# Patient Record
Sex: Male | Born: 1985 | Race: Black or African American | Hispanic: No | Marital: Single | Smoking: Current every day smoker
Health system: Southern US, Community
[De-identification: ages and names within clinical notes are randomized; demographics above are authoritative.]

## PROBLEM LIST (undated history)

## (undated) DIAGNOSIS — F209 Schizophrenia, unspecified: Secondary | ICD-10-CM

## (undated) DIAGNOSIS — F319 Bipolar disorder, unspecified: Secondary | ICD-10-CM

---

## 2004-05-14 ENCOUNTER — Emergency Department (HOSPITAL_COMMUNITY): Admission: EM | Admit: 2004-05-14 | Discharge: 2004-05-14 | Payer: Self-pay | Admitting: Emergency Medicine

## 2005-05-15 ENCOUNTER — Emergency Department (HOSPITAL_COMMUNITY): Admission: EM | Admit: 2005-05-15 | Discharge: 2005-05-16 | Payer: Self-pay | Admitting: *Deleted

## 2006-01-03 ENCOUNTER — Emergency Department (HOSPITAL_COMMUNITY): Admission: EM | Admit: 2006-01-03 | Discharge: 2006-01-03 | Payer: Self-pay | Admitting: Emergency Medicine

## 2006-01-08 ENCOUNTER — Emergency Department (HOSPITAL_COMMUNITY): Admission: EM | Admit: 2006-01-08 | Discharge: 2006-01-09 | Payer: Self-pay | Admitting: Emergency Medicine

## 2014-05-09 ENCOUNTER — Emergency Department (HOSPITAL_COMMUNITY)
Admission: EM | Admit: 2014-05-09 | Discharge: 2014-05-09 | Disposition: A | Discharge status: Other Acute Inpatient Hospital | Payer: Medicaid Other | Attending: Emergency Medicine | Admitting: Emergency Medicine

## 2014-05-09 ENCOUNTER — Emergency Department (HOSPITAL_COMMUNITY): Payer: Medicaid Other

## 2014-05-09 ENCOUNTER — Encounter (HOSPITAL_COMMUNITY): Payer: Self-pay | Admitting: Emergency Medicine

## 2014-05-09 DIAGNOSIS — Y9389 Activity, other specified: Secondary | ICD-10-CM | POA: Diagnosis not present

## 2014-05-09 DIAGNOSIS — F10129 Alcohol abuse with intoxication, unspecified: Secondary | ICD-10-CM | POA: Diagnosis not present

## 2014-05-09 DIAGNOSIS — F10920 Alcohol use, unspecified with intoxication, uncomplicated: Secondary | ICD-10-CM

## 2014-05-09 DIAGNOSIS — S0181XA Laceration without foreign body of other part of head, initial encounter: Secondary | ICD-10-CM

## 2014-05-09 DIAGNOSIS — Y999 Unspecified external cause status: Secondary | ICD-10-CM | POA: Diagnosis not present

## 2014-05-09 DIAGNOSIS — Y929 Unspecified place or not applicable: Secondary | ICD-10-CM | POA: Insufficient documentation

## 2014-05-09 DIAGNOSIS — S0990XA Unspecified injury of head, initial encounter: Secondary | ICD-10-CM | POA: Diagnosis present

## 2014-05-09 HISTORY — DX: Schizophrenia, unspecified: F20.9

## 2014-05-09 LAB — BASIC METABOLIC PANEL
Anion gap: 19 — ABNORMAL HIGH (ref 5–15)
BUN: 12 mg/dL (ref 6–23)
CO2: 21 mEq/L (ref 19–32)
Calcium: 8.9 mg/dL (ref 8.4–10.5)
Chloride: 105 mEq/L (ref 96–112)
Creatinine, Ser: 1.29 mg/dL (ref 0.50–1.35)
GFR calc Af Amer: 85 mL/min — ABNORMAL LOW (ref >90)
GFR calc non Af Amer: 73 mL/min — ABNORMAL LOW (ref >90)
Glucose, Bld: 105 mg/dL — ABNORMAL HIGH (ref 70–99)
Potassium: 3.6 mEq/L — ABNORMAL LOW (ref 3.7–5.3)
Sodium: 145 mEq/L (ref 137–147)

## 2014-05-09 LAB — CBC WITH DIFFERENTIAL/PLATELET
Basophils Absolute: 0 10*3/uL (ref 0.0–0.1)
Basophils Relative: 0 % (ref 0–1)
Eosinophils Absolute: 0 10*3/uL (ref 0.0–0.7)
Eosinophils Relative: 1 % (ref 0–5)
HCT: 38.2 % — ABNORMAL LOW (ref 39.0–52.0)
Hemoglobin: 13.2 g/dL (ref 13.0–17.0)
Lymphocytes Relative: 58 % — ABNORMAL HIGH (ref 12–46)
Lymphs Abs: 4.8 10*3/uL — ABNORMAL HIGH (ref 0.7–4.0)
MCH: 30.3 pg (ref 26.0–34.0)
MCHC: 34.6 g/dL (ref 30.0–36.0)
MCV: 87.8 fL (ref 78.0–100.0)
Monocytes Absolute: 0.7 10*3/uL (ref 0.1–1.0)
Monocytes Relative: 8 % (ref 3–12)
Neutro Abs: 2.7 10*3/uL (ref 1.7–7.7)
Neutrophils Relative %: 33 % — ABNORMAL LOW (ref 43–77)
Platelets: 208 10*3/uL (ref 150–400)
RBC: 4.35 MIL/uL (ref 4.22–5.81)
RDW: 14.2 % (ref 11.5–15.5)
WBC: 8.2 10*3/uL (ref 4.0–10.5)

## 2014-05-09 LAB — ETHANOL: Alcohol, Ethyl (B): 272 mg/dL — ABNORMAL HIGH (ref 0–11)

## 2014-05-09 MED ORDER — ZIPRASIDONE MESYLATE 20 MG IM SOLR
INTRAMUSCULAR | Status: AC
  Filled 2014-05-09: qty 20

## 2014-05-09 MED ORDER — ZIPRASIDONE MESYLATE 20 MG IM SOLR
20.0000 mg | Freq: Once | INTRAMUSCULAR | Status: AC
  Administered 2014-05-09: 20 mg via INTRAMUSCULAR

## 2014-05-09 MED ORDER — TETANUS-DIPHTH-ACELL PERTUSSIS 5-2.5-18.5 LF-MCG/0.5 IM SUSP
0.5000 mL | Freq: Once | INTRAMUSCULAR | Status: AC
  Administered 2014-05-09: 0.5 mL via INTRAMUSCULAR
  Filled 2014-05-09: qty 0.5

## 2014-05-09 MED ORDER — LIDOCAINE-EPINEPHRINE (PF) 2 %-1:200000 IJ SOLN
20.0000 mL | Freq: Once | INTRAMUSCULAR | Status: AC
  Administered 2014-05-09: 20 mL
  Filled 2014-05-09: qty 20
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
## 2014-05-09 NOTE — ED Notes (Signed)
MD at bedside. 
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
## 2014-05-09 NOTE — Discharge Instructions (Signed)
Stitches need to be removed in five days. That can be done at a doctor's office, an urgent care center, or in the Emergency Department.  Laceration Care, Adult A laceration is a cut or lesion that goes through all layers of the skin and into the tissue just beneath the skin. TREATMENT  Some lacerations may not require closure. Some lacerations may not be able to be closed due to an increased risk of infection. It is important to see your caregiver as soon as possible after an injury to minimize the risk of infection and maximize the opportunity for successful closure. If closure is appropriate, pain medicines may be given, if needed. The wound will be cleaned to help prevent infection. Your caregiver will use stitches (sutures), staples, wound glue (adhesive), or skin adhesive strips to repair the laceration. These tools bring the skin edges together to allow for faster healing and a better cosmetic outcome. However, all wounds will heal with a scar. Once the wound has healed, scarring can be minimized by covering the wound with sunscreen during the day for 1 full year. HOME CARE INSTRUCTIONS  For sutures or staples:  Keep the wound clean and dry.  If you were given a bandage (dressing), you should change it at least once a day. Also, change the dressing if it becomes wet or dirty, or as directed by your caregiver.  Wash the wound with soap and water 2 times a day. Rinse the wound off with water to remove all soap. Pat the wound dry with a clean towel.  After cleaning, apply a thin layer of the antibiotic ointment as recommended by your caregiver. This will help prevent infection and keep the dressing from sticking.  You may shower as usual after the first 24 hours. Do not soak the wound in water until the sutures are removed.  Only take over-the-counter or prescription medicines for pain, discomfort, or fever as directed by your caregiver.  Get your sutures or staples removed as directed by  your caregiver. For skin adhesive strips:  Keep the wound clean and dry.  Do not get the skin adhesive strips wet. You may bathe carefully, using caution to keep the wound dry.  If the wound gets wet, pat it dry with a clean towel.  Skin adhesive strips will fall off on their own. You may trim the strips as the wound heals. Do not remove skin adhesive strips that are still stuck to the wound. They will fall off in time. For wound adhesive:  You may briefly wet your wound in the shower or bath. Do not soak or scrub the wound. Do not swim. Avoid periods of heavy perspiration until the skin adhesive has fallen off on its own. After showering or bathing, gently pat the wound dry with a clean towel.  Do not apply liquid medicine, cream medicine, or ointment medicine to your wound while the skin adhesive is in place. This may loosen the film before your wound is healed.  If a dressing is placed over the wound, be careful not to apply tape directly over the skin adhesive. This may cause the adhesive to be pulled off before the wound is healed.  Avoid prolonged exposure to sunlight or tanning lamps while the skin adhesive is in place. Exposure to ultraviolet light in the first year will darken the scar.  The skin adhesive will usually remain in place for 5 to 10 days, then naturally fall off the skin. Do not pick at the adhesive  film. You may need a tetanus shot if:  You cannot remember when you had your last tetanus shot.  You have never had a tetanus shot. If you get a tetanus shot, your arm may swell, get red, and feel warm to the touch. This is common and not a problem. If you need a tetanus shot and you choose not to have one, there is a rare chance of getting tetanus. Sickness from tetanus can be serious. SEEK MEDICAL CARE IF:   You have redness, swelling, or increasing pain in the wound.  You see a red line that goes away from the wound.  You have yellowish-white fluid (pus) coming  from the wound.  You have a fever.  You notice a bad smell coming from the wound or dressing.  Your wound breaks open before or after sutures have been removed.  You notice something coming out of the wound such as wood or glass.  Your wound is on your hand or foot and you cannot move a finger or toe. SEEK IMMEDIATE MEDICAL CARE IF:   Your pain is not controlled with prescribed medicine.  You have severe swelling around the wound causing pain and numbness or a change in color in your arm, hand, leg, or foot.  Your wound splits open and starts bleeding.  You have worsening numbness, weakness, or loss of function of any joint around or beyond the wound.  You develop painful lumps near the wound or on the skin anywhere on your body. MAKE SURE YOU:   Understand these instructions.  Will watch your condition.  Will get help right away if you are not doing well or get worse. Document Released: 06/20/2005 Document Revised: 09/12/2011 Document Reviewed: 12/14/2010 [facility] Patient Information 2015 [place], [place]. This information is not intended to replace advice given to you by your health care provider. Make sure you discuss any questions you have with your health care provider.   Alcohol Intoxication Alcohol intoxication occurs when the amount of alcohol that a person has consumed impairs his or her ability to mentally and physically function. Alcohol directly impairs the normal chemical activity of the brain. Drinking large amounts of alcohol can lead to changes in mental function and behavior, and it can cause many physical effects that can be harmful.  Alcohol intoxication can range in severity from mild to very severe. Various factors can affect the level of intoxication that occurs, such as the person's age, gender, weight, frequency of alcohol consumption, and the presence of other medical conditions (such as diabetes, seizures, or heart conditions). Dangerous levels of alcohol  intoxication may occur when people drink large amounts of alcohol in a short period (binge drinking). Alcohol can also be especially dangerous when combined with certain prescription medicines or "recreational" drugs. SIGNS AND SYMPTOMS Some common signs and symptoms of mild alcohol intoxication include:  Loss of coordination.  Changes in mood and behavior.  Impaired judgment.  Slurred speech. As alcohol intoxication progresses to more severe levels, other signs and symptoms will appear. These may include:  Vomiting.  Confusion and impaired memory.  Slowed breathing.  Seizures.  Loss of consciousness. DIAGNOSIS  Your health care provider will take a medical history and perform a physical exam. You will be asked about the amount and type of alcohol you have consumed. Blood tests will be done to measure the concentration of alcohol in your blood. In many places, your blood alcohol level must be lower than 80 mg/dL (2.84%0.08%) to legally drive. However,  many dangerous effects of alcohol can occur at much lower levels.  TREATMENT  People with alcohol intoxication often do not require treatment. Most of the effects of alcohol intoxication are temporary, and they go away as the alcohol naturally leaves the body. Your health care provider will monitor your condition until you are stable enough to go home. Fluids are sometimes given through an IV access tube to help prevent dehydration.  HOME CARE INSTRUCTIONS  Do not drive after drinking alcohol.  Stay hydrated. Drink enough water and fluids to keep your urine clear or pale yellow. Avoid caffeine.   Only take over-the-counter or prescription medicines as directed by your health care provider.  SEEK MEDICAL CARE IF:   You have persistent vomiting.   You do not feel better after a few days.  You have frequent alcohol intoxication. Your health care provider can help determine if you should see a substance use treatment counselor. SEEK  IMMEDIATE MEDICAL CARE IF:   You become shaky or tremble when you try to stop drinking.   You shake uncontrollably (seizure).   You throw up (vomit) blood. This may be bright red or may look like black coffee grounds.   You have blood in your stool. This may be bright red or may appear as a black, tarry, bad smelling stool.   You become lightheaded or faint.  MAKE SURE YOU:   Understand these instructions.  Will watch your condition.  Will get help right away if you are not doing well or get worse. Document Released: 03/30/2005 Document Revised: 02/20/2013 Document Reviewed: 11/23/2012 [facility] Patient Information 2015 [place], [place]. This information is not intended to replace advice given to you by your health care provider. Make sure you discuss any questions you have with your health care provider.  Assault, General Assault includes any behavior, whether intentional or reckless, which results in bodily injury to another person and/or damage to property. Included in this would be any behavior, intentional or reckless, that by its nature would be understood (interpreted) by a reasonable person as intent to harm another person or to damage his/her property. Threats may be oral or written. They may be communicated through regular mail, computer, fax, or phone. These threats may be direct or implied. FORMS OF ASSAULT INCLUDE:  Physically assaulting a person. This includes physical threats to inflict physical harm as well as:  Slapping.  Hitting.  Poking.  Kicking.  Punching.  Pushing.  Arson.  Sabotage.  Equipment vandalism.  Damaging or destroying property.  Throwing or hitting objects.  Displaying a weapon or an object that appears to be a weapon in a threatening manner.  Carrying a firearm of any kind.  Using a weapon to harm someone.  Using greater physical size/strength to intimidate another.  Making intimidating or threatening  gestures.  Bullying.  Hazing.  Intimidating, threatening, hostile, or abusive language directed toward another person.  It communicates the intention to engage in violence against that person. And it leads a reasonable person to expect that violent behavior may occur.  Stalking another person. IF IT HAPPENS AGAIN:  Immediately call for emergency help (911 in [place]  If someone poses clear and immediate danger to you, seek legal authorities to have a protective or restraining order put in place.  Less threatening assaults can at least be reported to authorities. STEPS TO TAKE IF A SEXUAL ASSAULT HAS HAPPENED  Go to an area of safety. This may include a shelter or staying with a friend. Stay  away from the area where you have been attacked. A large percentage of sexual assaults are caused by a friend, relative or associate.  If medications were given by your caregiver, take them as directed for the full length of time prescribed.  Only take over-the-counter or prescription medicines for pain, discomfort, or fever as directed by your caregiver.  If you have come in contact with a sexual disease, find out if you are to be tested again. If your caregiver is concerned about the HIV/AIDS virus, he/she may require you to have continued testing for several months.  For the protection of your privacy, test results can not be given over the phone. Make sure you receive the results of your test. If your test results are not back during your visit, make an appointment with your caregiver to find out the results. Do not assume everything is normal if you have not heard from your caregiver or the medical facility. It is important for you to follow up on all of your test results.  File appropriate papers with authorities. This is important in all assaults, even if it has occurred in a family or by a friend. SEEK MEDICAL CARE IF:  You have new problems because of your injuries.  You have problems  that may be because of the medicine you are taking, such as:  Rash.  Itching.  Swelling.  Trouble breathing.  You develop belly (abdominal) pain, feel sick to your stomach (nausea) or are vomiting.  You begin to run a temperature.  You need supportive care or referral to a rape crisis center. These are centers with trained personnel who can help you get through this ordeal. SEEK IMMEDIATE MEDICAL CARE IF:  You are afraid of being threatened, beaten, or abused. In [place] call 911.  You receive new injuries related to abuse.  You develop severe pain in any area injured in the assault or have any change in your condition that concerns you.  You faint or lose consciousness.  You develop chest pain or shortness of breath. Document Released: 06/20/2005 Document Revised: 09/12/2011 Document Reviewed: 02/06/2008 [facility] Patient Information 2015 [place], [place]. This information is not intended to replace advice given to you by your health care provider. Make sure you discuss any questions you have with your health care provider.  Td Vaccine (Tetanus and Diphtheria): What You Need to Know 1. Why get vaccinated? Tetanus  and diphtheria are very serious diseases. They are rare in the [place] today, but people who do become infected often have severe complications. Td vaccine is used to protect adolescents and adults from both of these diseases. Both tetanus and diphtheria are infections caused by bacteria. Diphtheria spreads from person to person through coughing or sneezing. Tetanus-causing bacteria enter the body through cuts, scratches, or wounds. TETANUS (Lockjaw) causes painful muscle tightening and stiffness, usually all over the body.  It can lead to tightening of muscles in the head and neck so you can't open your mouth, swallow, or sometimes even breathe. Tetanus kills about 1 out of every 5 people who are infected. DIPHTHERIA can cause a thick coating to form in the back of  the throat.  It can lead to breathing problems, paralysis, heart failure, and death. Before vaccines, the [place] saw as many as 200,000 cases a year of diphtheria and hundreds of cases of tetanus. Since vaccination began, cases of both diseases have dropped by about 99%. 2. Td vaccine Td vaccine can protect adolescents and adults from  tetanus and diphtheria. Td is usually given as a booster dose every 10 years but it can also be given earlier after a severe and dirty wound or burn. Your doctor can give you more information. Td may safely be given at the same time as other vaccines. 3. Some people should not get this vaccine  If you ever had a life-threatening allergic reaction after a dose of any tetanus or diphtheria containing vaccine, OR if you have a severe allergy to any part of this vaccine, you should not get Td. Tell your doctor if you have any severe allergies.  Talk to your doctor if you:  have epilepsy or another nervous system problem,  had severe pain or swelling after any vaccine containing diphtheria or tetanus,  ever had Guillain Barr Syndrome (GBS),  aren't feeling well on the day the shot is scheduled. 4. Risks of a vaccine reaction With a vaccine, like any medicine, there is a chance of side effects. These are usually mild and go away on their own. Serious side effects are also possible, but are very rare. Most people who get Td vaccine do not have any problems with it. Mild Problems  following Td (Did not interfere with activities)  Pain where the shot was given (about 8 people in 10)  Redness or swelling where the shot was given (about 1 person in 3)  Mild fever (about 1 person in 15)  Headache or Tiredness (uncommon) Moderate Problems following Td (Interfered with activities, but did not require medical attention)  Fever over 102F (rare) Severe Problems  following Td (Unable to perform usual activities; required medical attention)  Swelling,  severe pain, bleeding and/or redness in the arm where the shot was given (rare). Problems that could happen after any vaccine:  Brief fainting spells can happen after any medical procedure, including vaccination. Sitting or lying down for about 15 minutes can help prevent fainting, and injuries caused by a fall. Tell your doctor if you feel dizzy, or have vision changes or ringing in the ears.  Severe shoulder pain and reduced range of motion in the arm where a shot was given can happen, very rarely, after a vaccination.  Severe allergic reactions from a vaccine are very rare, estimated at less than 1 in a million doses. If one were to occur, it would usually be within a few minutes to a few hours after the vaccination. 5. What if there is a serious reaction? What should I look for?  Look for anything that concerns you, such as signs of a severe allergic reaction, very high fever, or behavior changes. Signs of a severe allergic reaction can include hives, swelling of the face and throat, difficulty breathing, a fast heartbeat, dizziness, and weakness. These would usually start a few minutes to a few hours after the vaccination. What should I do?  If you think it is a severe allergic reaction or other emergency that can't wait, call 9-1-1 or get the person to the nearest hospital. Otherwise, call your doctor.  Afterward, the reaction should be reported to the Vaccine Adverse Event Reporting System (VAERS). Your doctor might file this report, or you can do it yourself through the VAERS web site at www.vaers.LAgents.no, or by calling 1-(539) 444-5225. VAERS is only for reporting reactions. They do not give medical advice. 6. The National Vaccine Injury Compensation Program The [employer] Vaccine Injury Compensation Program (VICP) is a federal program that was created to compensate people who may have been injured by certain vaccines.  Persons who believe they may have been injured by a vaccine can learn  about the program and about filing a claim by calling 1-(671)614-2733 or visiting the VICP website at SpiritualWord.at. 7. How can I learn more?  Ask your doctor.  Contact your local or state health department.  Contact the Centers for Disease Control and Prevention (CDC):  Call 443-447-4847 (1-800-CDC-INFO)  Visit CDC's website at PicCapture.uy CDC Td Vaccine Interim VIS (08/07/12) Document Released: 04/17/2006 Document Revised: 11/04/2013 Document Reviewed: 10/02/2013 [facility] Patient Information 2015 [place], [place]. This information is not intended to replace advice given to you by your health care provider. Make sure you discuss any questions you have with your health care provider.

## 2014-05-11 ENCOUNTER — Encounter (HOSPITAL_COMMUNITY): Payer: Self-pay | Admitting: Emergency Medicine

## 2014-05-11 ENCOUNTER — Emergency Department (HOSPITAL_COMMUNITY): Payer: Medicaid Other

## 2014-05-11 ENCOUNTER — Emergency Department (HOSPITAL_COMMUNITY)
Admission: EM | Admit: 2014-05-11 | Discharge: 2014-05-11 | Disposition: A | Discharge status: Home / Self Care | Payer: Medicaid Other | Attending: Emergency Medicine | Admitting: Emergency Medicine

## 2014-05-11 DIAGNOSIS — G43009 Migraine without aura, not intractable, without status migrainosus: Secondary | ICD-10-CM

## 2014-05-11 DIAGNOSIS — Z8659 Personal history of other mental and behavioral disorders: Secondary | ICD-10-CM | POA: Diagnosis not present

## 2014-05-11 DIAGNOSIS — G43909 Migraine, unspecified, not intractable, without status migrainosus: Secondary | ICD-10-CM | POA: Insufficient documentation

## 2014-05-11 DIAGNOSIS — Z720 Tobacco use: Secondary | ICD-10-CM | POA: Insufficient documentation

## 2014-05-11 DIAGNOSIS — S0093XD Contusion of unspecified part of head, subsequent encounter: Secondary | ICD-10-CM

## 2014-05-11 DIAGNOSIS — S0083XD Contusion of other part of head, subsequent encounter: Secondary | ICD-10-CM | POA: Insufficient documentation

## 2014-05-11 DIAGNOSIS — R51 Headache: Secondary | ICD-10-CM | POA: Diagnosis present

## 2014-05-11 HISTORY — DX: Bipolar disorder, unspecified: F31.9

## 2014-05-11 MED ORDER — METOCLOPRAMIDE HCL 5 MG/ML IJ SOLN
10.0000 mg | Freq: Once | INTRAMUSCULAR | Status: AC
  Administered 2014-05-11: 10 mg via INTRAVENOUS
  Filled 2014-05-11: qty 2

## 2014-05-11 MED ORDER — SODIUM CHLORIDE 0.9 % IV BOLUS (SEPSIS)
1000.0000 mL | Freq: Once | INTRAVENOUS | Status: AC
  Administered 2014-05-11: 1000 mL via INTRAVENOUS

## 2014-05-11 MED ORDER — ONDANSETRON HCL 4 MG/2ML IJ SOLN
4.0000 mg | Freq: Once | INTRAMUSCULAR | Status: AC
  Administered 2014-05-11: 4 mg via INTRAVENOUS
  Filled 2014-05-11: qty 2

## 2014-05-11 NOTE — ED Notes (Signed)
Pt reports headache x2 days. ETOH noted in triage. Pt reports had "two beers last night." nad noted. Pt reports nausea but denies any v/d.
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
## 2014-05-11 NOTE — ED Notes (Signed)
Patient given a drink per RN approval.

## 2014-05-11 NOTE — Discharge Instructions (Signed)
Go home and rest. Drink plenty of fluids. You can take ibuprofen 600 mg 4 times a day for pain if needed. Use ice packs to the painful swollen areas. Return in 2-3 days to have your sutures removed.

## 2014-05-14 ENCOUNTER — Encounter (HOSPITAL_COMMUNITY): Payer: Self-pay | Admitting: Emergency Medicine

## 2014-05-14 ENCOUNTER — Emergency Department (HOSPITAL_COMMUNITY)
Admission: EM | Admit: 2014-05-14 | Discharge: 2014-05-14 | Disposition: A | Discharge status: Home / Self Care | Payer: Medicaid Other | Attending: Emergency Medicine | Admitting: Emergency Medicine

## 2014-05-14 DIAGNOSIS — Z4802 Encounter for removal of sutures: Secondary | ICD-10-CM | POA: Insufficient documentation

## 2014-05-14 DIAGNOSIS — Z720 Tobacco use: Secondary | ICD-10-CM | POA: Insufficient documentation

## 2014-05-14 DIAGNOSIS — Z8659 Personal history of other mental and behavioral disorders: Secondary | ICD-10-CM | POA: Insufficient documentation

## 2014-05-14 NOTE — ED Notes (Signed)
Removed 8 sutures from laceration over r eye and removed 3 sutures from laceration over left eye.  No difficulty.  Pt tolerated procedure well.
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
## 2014-05-14 NOTE — Discharge Instructions (Signed)
Clean lacerations twice a day with soap and water

## 2014-05-14 NOTE — ED Notes (Signed)
Pt here for suture removal from R forehead.

## 2015-03-05 ENCOUNTER — Emergency Department (HOSPITAL_COMMUNITY): Payer: Medicaid Other

## 2015-03-05 ENCOUNTER — Emergency Department (HOSPITAL_COMMUNITY)
Admission: EM | Admit: 2015-03-05 | Discharge: 2015-03-06 | Disposition: A | Discharge status: Home / Self Care | Payer: Medicaid Other | Attending: Emergency Medicine | Admitting: Emergency Medicine

## 2015-03-05 ENCOUNTER — Encounter (HOSPITAL_COMMUNITY): Payer: Self-pay | Admitting: *Deleted

## 2015-03-05 DIAGNOSIS — S92021A Displaced fracture of anterior process of right calcaneus, initial encounter for closed fracture: Secondary | ICD-10-CM | POA: Diagnosis not present

## 2015-03-05 DIAGNOSIS — S92001A Unspecified fracture of right calcaneus, initial encounter for closed fracture: Secondary | ICD-10-CM

## 2015-03-05 DIAGNOSIS — Y998 Other external cause status: Secondary | ICD-10-CM | POA: Diagnosis not present

## 2015-03-05 DIAGNOSIS — Z8659 Personal history of other mental and behavioral disorders: Secondary | ICD-10-CM | POA: Diagnosis not present

## 2015-03-05 DIAGNOSIS — X58XXXA Exposure to other specified factors, initial encounter: Secondary | ICD-10-CM | POA: Diagnosis not present

## 2015-03-05 DIAGNOSIS — S99912A Unspecified injury of left ankle, initial encounter: Secondary | ICD-10-CM | POA: Diagnosis present

## 2015-03-05 DIAGNOSIS — Z720 Tobacco use: Secondary | ICD-10-CM | POA: Insufficient documentation

## 2015-03-05 DIAGNOSIS — Y9339 Activity, other involving climbing, rappelling and jumping off: Secondary | ICD-10-CM | POA: Insufficient documentation

## 2015-03-05 DIAGNOSIS — Y9289 Other specified places as the place of occurrence of the external cause: Secondary | ICD-10-CM | POA: Diagnosis not present

## 2015-03-05 MED ORDER — IBUPROFEN 800 MG PO TABS
800.0000 mg | ORAL_TABLET | Freq: Once | ORAL | Status: AC
  Administered 2015-03-05: 800 mg via ORAL
  Filled 2015-03-05: qty 1
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
## 2015-03-06 MED ORDER — HYDROCODONE-ACETAMINOPHEN 5-325 MG PO TABS: 1.0000 | ORAL_TABLET | ORAL | Status: DC | PRN

## 2015-03-06 NOTE — Discharge Instructions (Signed)
Cast or Splint Care °Casts and splints support injured limbs and keep bones from moving while they heal. It is important to care for your cast or splint at home.   °HOME CARE INSTRUCTIONS °· Keep the cast or splint uncovered during the drying period. It can take 24 to 48 hours to dry if it is made of plaster. A fiberglass cast will dry in less than 1 hour. °· Do not rest the cast on anything harder than a pillow for the first 24 hours. °· Do not put weight on your injured limb or apply pressure to the cast until your health care provider gives you permission. °· Keep the cast or splint dry. Wet casts or splints can lose their shape and may not support the limb as well. A wet cast that has lost its shape can also create harmful pressure on your skin when it dries. Also, wet skin can become infected. °¨ Cover the cast or splint with a plastic bag when bathing or when out in the rain or snow. If the cast is on the trunk of the body, take sponge baths until the cast is removed. °¨ If your cast does become wet, dry it with a towel or a blow dryer on the cool setting only. °· Keep your cast or splint clean. Soiled casts may be wiped with a moistened cloth. °· Do not place any hard or soft foreign objects under your cast or splint, such as cotton, toilet paper, lotion, or powder. °· Do not try to scratch the skin under the cast with any object. The object could get stuck inside the cast. Also, scratching could lead to an infection. If itching is a problem, use a blow dryer on a cool setting to relieve discomfort. °· Do not trim or cut your cast or remove padding from inside of it. °· Exercise all joints next to the injury that are not immobilized by the cast or splint. For example, if you have a long leg cast, exercise the hip joint and toes. If you have an arm cast or splint, exercise the shoulder, elbow, thumb, and fingers. °· Elevate your injured arm or leg on 1 or 2 pillows for the first 1 to 3 days to decrease  swelling and pain. It is best if you can comfortably elevate your cast so it is higher than your heart. °SEEK MEDICAL CARE IF:  °· Your cast or splint cracks. °· Your cast or splint is too tight or too loose. °· You have unbearable itching inside the cast. °· Your cast becomes wet or develops a soft spot or area. °· You have a bad smell coming from inside your cast. °· You get an object stuck under your cast. °· Your skin around the cast becomes red or raw. °· You have new pain or worsening pain after the cast has been applied. °SEEK IMMEDIATE MEDICAL CARE IF:  °· You have fluid leaking through the cast. °· You are unable to move your fingers or toes. °· You have discolored (blue or white), cool, painful, or very swollen fingers or toes beyond the cast. °· You have tingling or numbness around the injured area. °· You have severe pain or pressure under the cast. °· You have any difficulty with your breathing or have shortness of breath. °· You have chest pain. °Document Released: 06/17/2000 Document Revised: 04/10/2013 Document Reviewed: 12/27/2012 °[employer]® Patient Information ©2015 [employer]. This information is not intended to replace advice given to you by your health care   provider. Make sure you discuss any questions you have with your health care provider.   It is important that you do not bear weight on your foot as discussed, using your crutches at all times.  Ice and elevate as much as possible over the next several days to help minimize swelling.  You may take the hydrocodone prescribed for pain relief.  This will make you drowsy - do not drive within 4 hours of taking this medication.

## 2015-03-06 NOTE — ED Notes (Signed)
Pt had short leg splint applied by Sonia NT and Sydney NT, pt tolerated well, cms intact distal

## 2015-03-11 MED FILL — Hydrocodone-Acetaminophen Tab 5-325 MG: ORAL | Qty: 6 | Status: AC

## 2015-03-17 ENCOUNTER — Other Ambulatory Visit (HOSPITAL_COMMUNITY): Payer: Self-pay | Admitting: Orthopaedic Surgery

## 2015-03-17 DIAGNOSIS — T148XXA Other injury of unspecified body region, initial encounter: Secondary | ICD-10-CM

## 2015-03-18 ENCOUNTER — Ambulatory Visit (HOSPITAL_COMMUNITY)
Admission: RE | Admit: 2015-03-18 | Discharge: 2015-03-18 | Disposition: A | Discharge status: Home / Self Care | Payer: Medicaid Other | Source: Ambulatory Visit | Attending: Orthopaedic Surgery | Admitting: Orthopaedic Surgery

## 2015-03-18 DIAGNOSIS — S92061D Displaced intraarticular fracture of right calcaneus, subsequent encounter for fracture with routine healing: Secondary | ICD-10-CM | POA: Diagnosis present

## 2015-03-18 DIAGNOSIS — S92251D Displaced fracture of navicular [scaphoid] of right foot, subsequent encounter for fracture with routine healing: Secondary | ICD-10-CM | POA: Diagnosis not present

## 2015-03-18 DIAGNOSIS — S92101D Unspecified fracture of right talus, subsequent encounter for fracture with routine healing: Secondary | ICD-10-CM | POA: Insufficient documentation

## 2015-03-18 DIAGNOSIS — T148XXA Other injury of unspecified body region, initial encounter: Secondary | ICD-10-CM

## 2015-03-18 DIAGNOSIS — Y939 Activity, unspecified: Secondary | ICD-10-CM | POA: Diagnosis not present

## 2018-09-05 ENCOUNTER — Emergency Department (HOSPITAL_COMMUNITY)
Admission: EM | Admit: 2018-09-05 | Discharge: 2018-09-05 | Disposition: A | Discharge status: Home / Self Care | Payer: Medicaid Other | Attending: Emergency Medicine | Admitting: Emergency Medicine

## 2018-09-05 ENCOUNTER — Emergency Department (HOSPITAL_COMMUNITY): Payer: Medicaid Other

## 2018-09-05 ENCOUNTER — Other Ambulatory Visit: Payer: Self-pay

## 2018-09-05 ENCOUNTER — Encounter (HOSPITAL_COMMUNITY): Payer: Self-pay | Admitting: Emergency Medicine

## 2018-09-05 DIAGNOSIS — F17210 Nicotine dependence, cigarettes, uncomplicated: Secondary | ICD-10-CM | POA: Insufficient documentation

## 2018-09-05 DIAGNOSIS — R109 Unspecified abdominal pain: Secondary | ICD-10-CM | POA: Insufficient documentation

## 2018-09-05 LAB — COMPREHENSIVE METABOLIC PANEL
ALBUMIN: 4.6 g/dL (ref 3.5–5.0)
ALT: 25 U/L (ref 0–44)
AST: 27 U/L (ref 15–41)
Alkaline Phosphatase: 93 U/L (ref 38–126)
Anion gap: 11 (ref 5–15)
BUN: 11 mg/dL (ref 6–20)
CO2: 21 mmol/L — ABNORMAL LOW (ref 22–32)
CREATININE: 1.12 mg/dL (ref 0.61–1.24)
Calcium: 9.2 mg/dL (ref 8.9–10.3)
Chloride: 106 mmol/L (ref 98–111)
GFR calc Af Amer: >60 mL/min (ref >60)
GLUCOSE: 85 mg/dL (ref 70–99)
Potassium: 4 mmol/L (ref 3.5–5.1)
Sodium: 138 mmol/L (ref 135–145)
Total Bilirubin: 0.6 mg/dL (ref 0.3–1.2)
Total Protein: 8.2 g/dL — ABNORMAL HIGH (ref 6.5–8.1)

## 2018-09-05 LAB — CBC
HEMATOCRIT: 40.5 % (ref 39.0–52.0)
Hemoglobin: 13 g/dL (ref 13.0–17.0)
MCH: 28.4 pg (ref 26.0–34.0)
MCHC: 32.1 g/dL (ref 30.0–36.0)
MCV: 88.6 fL (ref 80.0–100.0)
NRBC: 0 % (ref 0.0–0.2)
PLATELETS: 213 10*3/uL (ref 150–400)
RBC: 4.57 MIL/uL (ref 4.22–5.81)
RDW: 14.2 % (ref 11.5–15.5)
WBC: 8.5 10*3/uL (ref 4.0–10.5)

## 2018-09-05 LAB — LIPASE, BLOOD: LIPASE: 20 U/L (ref 11–51)

## 2018-09-05 LAB — LACTIC ACID, PLASMA: Lactic Acid, Venous: 2.8 mmol/L (ref 0.5–1.9)

## 2018-09-05 MED ORDER — ONDANSETRON HCL 4 MG/2ML IJ SOLN
4.0000 mg | Freq: Once | INTRAMUSCULAR | Status: AC
  Administered 2018-09-05: 4 mg via INTRAVENOUS
  Filled 2018-09-05: qty 2

## 2018-09-05 MED ORDER — CYCLOBENZAPRINE HCL 10 MG PO TABS: 10.0000 mg | ORAL_TABLET | Freq: Two times a day (BID) | ORAL | 0 refills | Status: AC | PRN

## 2018-09-05 MED ORDER — SODIUM CHLORIDE 0.9 % IV BOLUS
1000.0000 mL | Freq: Once | INTRAVENOUS | Status: AC
  Administered 2018-09-05: 1000 mL via INTRAVENOUS

## 2018-09-05 MED ORDER — HYDROMORPHONE HCL 1 MG/ML IJ SOLN
0.5000 mg | Freq: Once | INTRAMUSCULAR | Status: AC
  Administered 2018-09-05: 0.5 mg via INTRAVENOUS
  Filled 2018-09-05: qty 1

## 2018-09-05 MED ORDER — IOHEXOL 300 MG/ML  SOLN
100.0000 mL | Freq: Once | INTRAMUSCULAR | Status: AC | PRN
  Administered 2018-09-05: 100 mL via INTRAVENOUS

## 2018-09-05 NOTE — ED Provider Notes (Signed)
[facility] Emergency Department Provider Note MRN:  [id number]  Arrival date & time: 09/05/18     Chief Complaint   Abdominal Pain   History of Present Illness   Kyle Russo is a 33 y.o. year-old male with a history of bipolar disorder presenting to the ED with chief complaint of abdominal pain.  Sudden onset abdominal pain 1 to 2 hours ago, severe, left upper quadrant, nonradiating, this is never happened before in the past.  Mild nausea, no vomiting, no diarrhea, no chest pain, no shortness of breath.  Pain is constant, unrelenting.  Review of Systems  A complete 10 system review of systems was obtained and all systems are negative except as noted in the HPI and PMH.   Patient's Health History    Past Medical History:  Diagnosis Date  . Bipolar 1 disorder (HCC)   . Schizophrenia (HCC)     History reviewed. No pertinent surgical history.  Family History  Problem Relation Age of Onset  . Hypertension Mother   . Hypertension Other     Social History   Socioeconomic History  . Marital status: Single    Spouse name: Not on file  . Number of children: Not on file  . Years of education: Not on file  . Highest education level: Not on file  Occupational History  . Not on file  Social Needs  . Financial resource strain: Not on file  . Food insecurity:    Worry: Not on file    Inability: Not on file  . Transportation needs:    Medical: Not on file    Non-medical: Not on file  Tobacco Use  . Smoking status: Current Every Day Smoker    Packs/day: 0.25  . Smokeless tobacco: Never Used  Substance and Sexual Activity  . Alcohol use: Yes    Comment: socially  . Drug use: No  . Sexual activity: Not on file  Lifestyle  . Physical activity:    Days per week: Not on file    Minutes per session: Not on file  . Stress: Not on file  Relationships  . Social connections:    Talks on phone: Not on file    Gets together: Not on file    Attends  religious service: Not on file    Active member of club or organization: Not on file    Attends meetings of clubs or organizations: Not on file    Relationship status: Not on file  . Intimate partner violence:    Fear of current or ex partner: Not on file    Emotionally abused: Not on file    Physically abused: Not on file    Forced sexual activity: Not on file  Other Topics Concern  . Not on file  Social History Narrative  . Not on file     Physical Exam  Vital Signs and Nursing Notes reviewed Vitals:   09/05/18 1422  BP: (!) 152/100  Pulse: (!) 117  Resp: 16  Temp: 98.7 F (37.1 C)  SpO2: 97%    CONSTITUTIONAL: Well-appearing, NAD NEURO:  Alert and oriented x 3, no focal deficits EYES:  eyes equal and reactive ENT/NECK:  no LAD, no JVD CARDIO: Regular rate, well-perfused, normal S1 and S2 PULM:  CTAB no wheezing or rhonchi GI/GU:  normal bowel sounds, non-distended, right upper quadrant tenderness palpation with seemingly rigid abdomen MSK/SPINE:  No gross deformities, no edema SKIN:  no rash, atraumatic PSYCH:  Appropriate speech  and behavior  Diagnostic and Interventional Summary    EKG Interpretation  Date/Time:  Wednesday September 05 2018 15:26:12 EST Ventricular Rate:  110 PR Interval:    QRS Duration: 107 QT Interval:  342 QTC Calculation: 463 R Axis:   90 Text Interpretation:  Sinus tachycardia Borderline right axis deviation Repol abnrm suggests ischemia, inferior leads Baseline wander in lead(s) I II III aVR aVL aVF V1 V3 V6 Confirmed by Kennis Carina [phone]) on 09/05/2018 3:35:32 PM      Labs Reviewed  COMPREHENSIVE METABOLIC PANEL - Abnormal; Notable for the following components:      Result Value   CO2 21 (*)    Total Protein 8.2 (*)    All other components within normal limits  LACTIC ACID, PLASMA - Abnormal; Notable for the following components:   Lactic Acid, Venous 2.8 (*)    All other components within normal limits  CBC  LIPASE, BLOOD      CT ABDOMEN PELVIS W CONTRAST  Final Result      Medications  HYDROmorphone (DILAUDID) injection 0.5 mg (0.5 mg Intravenous Given 09/05/18 1535)  ondansetron (ZOFRAN) injection 4 mg (4 mg Intravenous Given 09/05/18 1535)  sodium chloride 0.9 % bolus 1,000 mL (1,000 mLs Intravenous New Bag/Given 09/05/18 1536)  sodium chloride 0.9 % bolus 1,000 mL (1,000 mLs Intravenous New Bag/Given 09/05/18 1550)  iohexol (OMNIPAQUE) 300 MG/ML solution 100 mL (100 mLs Intravenous Contrast Given 09/05/18 1738)     Procedures Critical Care  ED Course and Medical Decision Making  I have reviewed the triage vital signs and the nursing notes.  Pertinent labs & imaging results that were available during my care of the patient were reviewed by me and considered in my medical decision making (see below for details).  CT to exclude perforated viscus in this 33 year old man with acute abdominal pain, guarding.  CT is unrevealing with no acute process.  Labs revealed an initial lactate that was elevated, as was patient's heart rate.  After fluids, patient is feeling better, heart rate on my repeat assessment in the 80s.  Labs otherwise reassuring.  Upon closer examination, pain seems mostly located to the ribs, suspect MSK.  Patient denies chest pain or shortness of breath, no evidence of DVT, nothing to suggest PE.  After the discussed management above, the patient was determined to be safe for discharge.  The patient was in agreement with this plan and all questions regarding their care were answered.  ED return precautions were discussed and the patient will return to the ED with any significant worsening of condition.  Elmer Sow. Pilar Plate, MD [facility] Emergency Medicine [facility] [email or web address]  Final Clinical Impressions(s) / ED Diagnoses     ICD-10-CM   1. Abdominal pain, unspecified abdominal location R10.9     ED Discharge Orders         Ordered    cyclobenzaprine (FLEXERIL) 10 MG  tablet  2 times daily PRN     09/05/18 1824             Sabas Sous, MD 09/05/18 [phone]

## 2018-09-05 NOTE — ED Triage Notes (Signed)
Patient brought in by EMS for complaint of abdominal pain starting today at 1300. Denies vomiting or diarrhea.

## 2018-09-05 NOTE — Discharge Instructions (Addendum)
You were evaluated in the Emergency Department and after careful evaluation, we did not find any emergent condition requiring admission or further testing in the hospital.  Your blood work and CT scan today were reassuring.  Use Tylenol or ibuprofen at home for pain.  You can use the muscle relaxers at night if you are having trouble sleeping due to the pain.  Please return to the Emergency Department if you experience any worsening of your condition.  We encourage you to follow up with a primary care provider.  Thank you for allowing [place] to be a part of your care.

## 2018-09-05 NOTE — ED Notes (Signed)
Date and time results received: 09/05/18 1538 (use smartphrase ".now" to insert current time)  Test: lactic acid  Critical Value: 2.8  Name of Provider Notified: MD Bero  Orders Received? Or Actions Taken?: n/a

## 2020-04-23 NOTE — Progress Notes (Deleted)
Psychiatric Initial Adult Assessment   Patient Identification: Kyle Russo MRN:  [id number] Date of Evaluation:  04/23/2020 Referral Source: *** Chief Complaint:   Visit Diagnosis: No diagnosis found.  History of Present Illness:   Kyle Russo is a 34 y.o. year old male with a history of intellectual disorder, bipolar disorder, who is referred for         Associated Signs/Symptoms: Depression Symptoms:  {DEPRESSION SYMPTOMS:20000} (Hypo) Manic Symptoms:  {BHH MANIC SYMPTOMS:22872} Anxiety Symptoms:  {BHH ANXIETY SYMPTOMS:22873} Psychotic Symptoms:  {BHH PSYCHOTIC SYMPTOMS:22874} PTSD Symptoms: {BHH PTSD SYMPTOMS:22875}  Past Psychiatric History:  Outpatient:  Psychiatry admission:  Previous suicide attempt:  Past trials of medication:  History of violence:   Previous Psychotropic Medications: {YES/NO:21197}  Substance Abuse History in the last 12 months:  {yes no:314532}  Consequences of Substance Abuse: {BHH CONSEQUENCES OF SUBSTANCE ABUSE:22880}  Past Medical History:  Past Medical History:  Diagnosis Date  . Bipolar 1 disorder (HCC)   . Schizophrenia (HCC)    No past surgical history on file.  Family Psychiatric History: ***  Family History:  Family History  Problem Relation Age of Onset  . Hypertension Mother   . Hypertension Other     Social History:   Social History   Socioeconomic History  . Marital status: Single    Spouse name: Not on file  . Number of children: Not on file  . Years of education: Not on file  . Highest education level: Not on file  Occupational History  . Not on file  Tobacco Use  . Smoking status: Current Every Day Smoker    Packs/day: 0.25  . Smokeless tobacco: Never Used  Substance and Sexual Activity  . Alcohol use: Yes    Comment: socially  . Drug use: No  . Sexual activity: Not on file  Other Topics Concern  . Not on file  Social History Narrative  . Not on file   Social Determinants of Health    Financial Resource Strain:   . Difficulty of Paying Living Expenses: Not on file  Food Insecurity:   . Worried About Programme researcher, broadcasting/film/video in the Last Year: Not on file  . Ran Out of Food in the Last Year: Not on file  Transportation Needs:   . Lack of Transportation (Medical): Not on file  . Lack of Transportation (Non-Medical): Not on file  Physical Activity:   . Days of Exercise per Week: Not on file  . Minutes of Exercise per Session: Not on file  Stress:   . Feeling of Stress : Not on file  Social Connections:   . Frequency of Communication with Friends and Family: Not on file  . Frequency of Social Gatherings with Friends and Family: Not on file  . Attends Religious Services: Not on file  . Active Member of Clubs or Organizations: Not on file  . Attends Banker Meetings: Not on file  . Marital Status: Not on file    Additional Social History: ***  Allergies:  No Known Allergies  Metabolic Disorder Labs: No results found for: HGBA1C, MPG No results found for: PROLACTIN No results found for: CHOL, TRIG, HDL, CHOLHDL, VLDL, LDLCALC No results found for: TSH  Therapeutic Level Labs: No results found for: LITHIUM No results found for: CBMZ No results found for: VALPROATE  Current Medications: Current Outpatient Medications  Medication Sig Dispense Refill  . cyclobenzaprine (FLEXERIL) 10 MG tablet Take 1 tablet (10 mg total) by mouth 2 (two)  times daily as needed for muscle spasms. 20 tablet 0  . HYDROcodone-acetaminophen (NORCO/VICODIN) 5-325 MG per tablet Take 1 tablet by mouth every 4 (four) hours as needed. (Patient not taking: Reported on 09/05/2018) 6 tablet 0  . HYDROcodone-acetaminophen (NORCO/VICODIN) 5-325 MG per tablet Take 1 tablet by mouth every 4 (four) hours as needed. (Patient not taking: Reported on 09/05/2018) 20 tablet 0   No current facility-administered medications for this visit.    Musculoskeletal: Strength & Muscle Tone: N/A Gait &  Station: N/A Patient leans: N/A  Psychiatric Specialty Exam: Review of Systems  There were no vitals taken for this visit.There is no height or weight on file to calculate BMI.  General Appearance: {Appearance:22683}  Eye Contact:  {BHH EYE CONTACT:22684}  Speech:  Clear and Coherent  Volume:  Normal  Mood:  {BHH MOOD:22306}  Affect:  {Affect (PAA):22687}  Thought Process:  Coherent  Orientation:  Full (Time, Place, and Person)  Thought Content:  Logical  Suicidal Thoughts:  {ST/HT (PAA):22692}  Homicidal Thoughts:  {ST/HT (PAA):22692}  Memory:  Immediate;   Good  Judgement:  {Judgement (PAA):22694}  Insight:  {Insight (PAA):22695}  Psychomotor Activity:  Normal  Concentration:  Concentration: Good and Attention Span: Good  Recall:  Good  Fund of Knowledge:Good  Language: Good  Akathisia:  No  Handed:  Right  AIMS (if indicated):  not done  Assets:  Communication Skills Desire for Improvement  ADL's:  Intact  Cognition: WNL  Sleep:  {BHH GOOD/FAIR/POOR:22877}   Screenings:   Assessment and Plan:  Assessment  Plan  The patient demonstrates the following risk factors for suicide: Chronic risk factors for suicide include: {Chronic Risk Factors for UXNATFT:73220254}. Acute risk factors for suicide include: {Acute Risk Factors for YHCWCBJ:62831517}. Protective factors for this patient include: {Protective Factors for Suicide OHYW:73710626}. Considering these factors, the overall suicide risk at this point appears to be {Desc; low/moderate/high:110033}. Patient {ACTION; IS/IS RSW:54627035} appropriate for outpatient follow up.   Neysa Hotter, MD 10/21/20212:40 PM

## 2020-04-30 ENCOUNTER — Telehealth (HOSPITAL_COMMUNITY): Payer: Medicaid Other | Admitting: Psychiatry

## 2020-04-30 ENCOUNTER — Other Ambulatory Visit: Payer: Self-pay

## 2020-04-30 ENCOUNTER — Telehealth (HOSPITAL_COMMUNITY): Payer: Self-pay | Admitting: Psychiatry

## 2020-04-30 NOTE — Telephone Encounter (Signed)
He could not start video visit due to technical difficulty. He agrees to contact the office for reschedule when he has a set up for video visit.

## 2020-11-03 IMAGING — CT CT ABD-PELV W/ CM
2 of 4 series · 16 of 46 positions shown, 18 images · IV contrast (Isovue)
Comparison: No priors.

CLINICAL DATA: 32-year-old male with complaint of abdominal pain
starting today.

EXAM:
CT ABDOMEN AND PELVIS WITH CONTRAST
TECHNIQUE: Multidetector CT imaging of the abdomen and pelvis was performed
using the standard protocol following bolus administration of
intravenous contrast.
CONTRAST:  100mL OMNIPAQUE IOHEXOL 300 MG/ML  SOLN

[Series 2: axial st · axial · 0.74mm/px · z∈[-441,-16]mm · 13 of 95 slices shown, 15 images]
[im 5/95  soft-tissue]
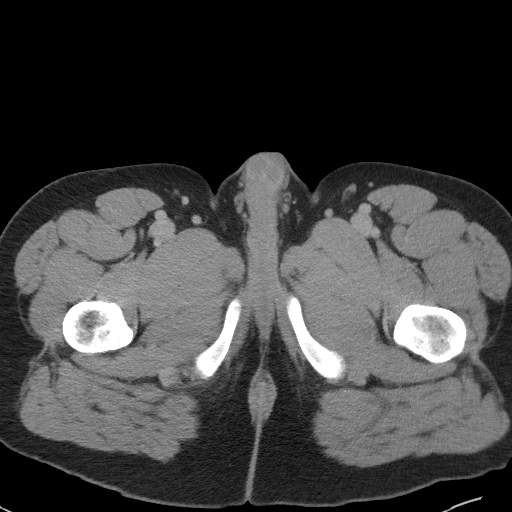
[im 5/95  bone]
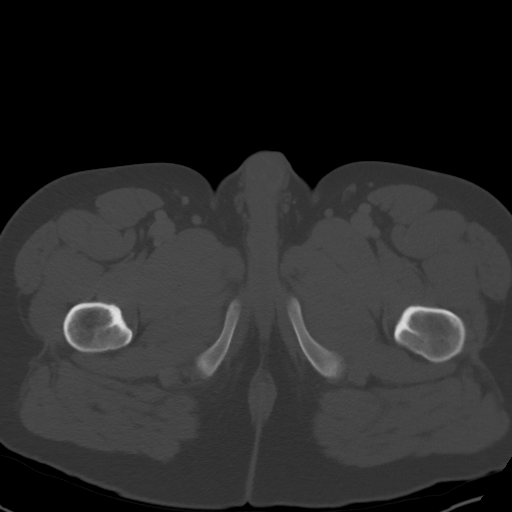
[im 13/95  soft-tissue]
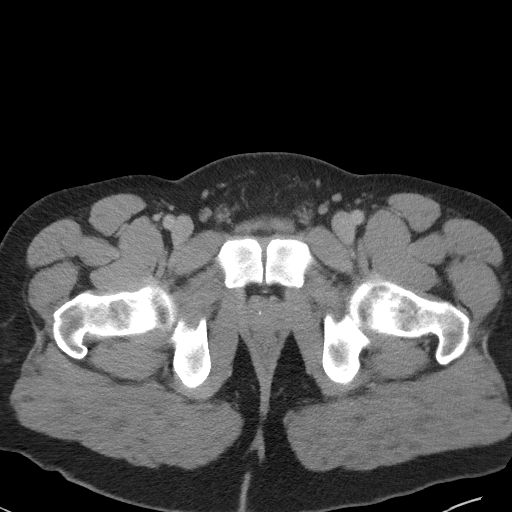
[im 21/95  soft-tissue]
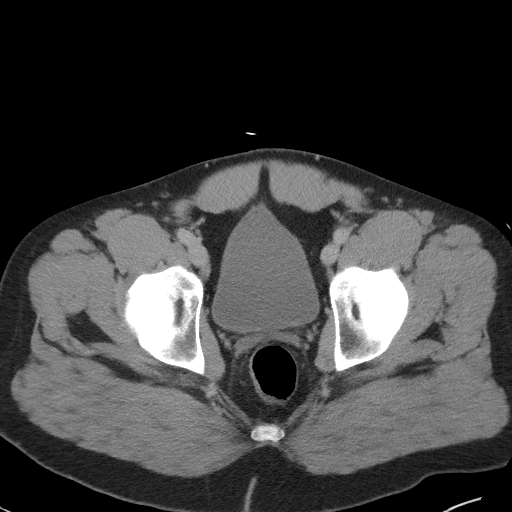
[im 25/95  soft-tissue]
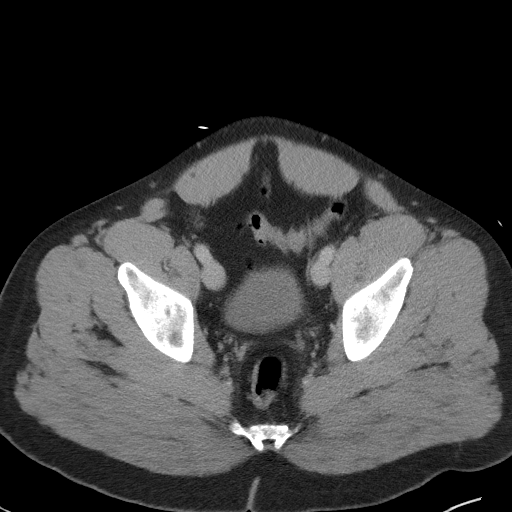
[im 33/95  soft-tissue]
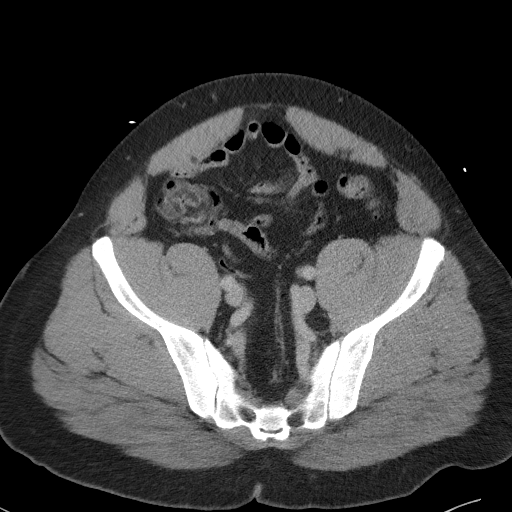
[im 41/95  soft-tissue]
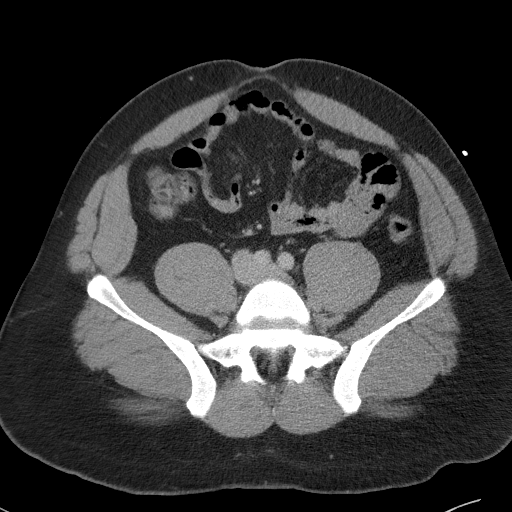
[im 50/95  soft-tissue]
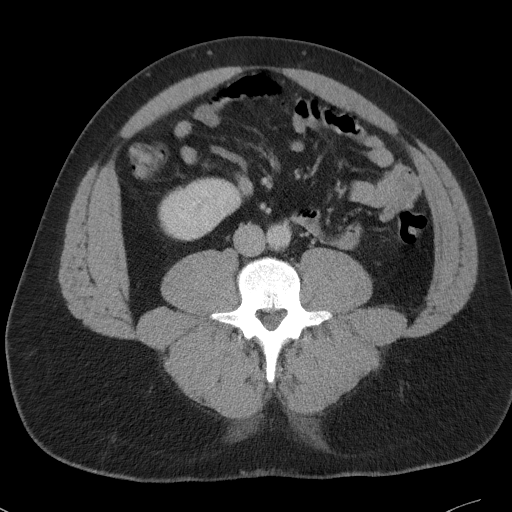
[im 54/95  soft-tissue]
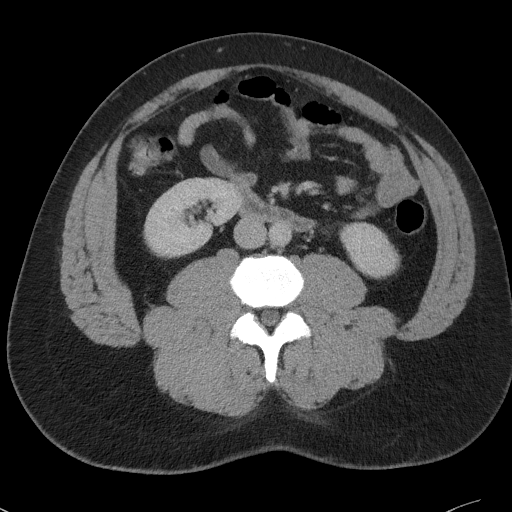
[im 62/95  soft-tissue]
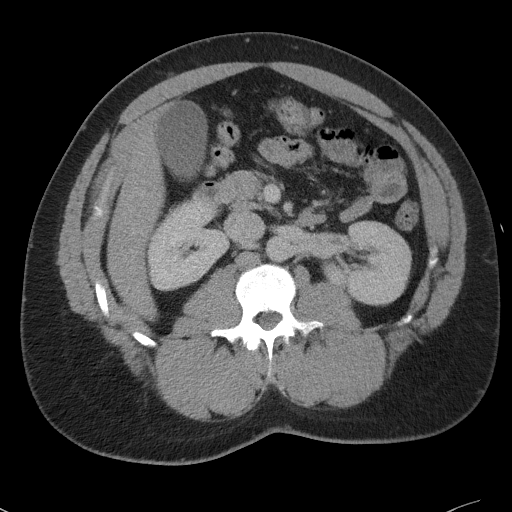
[im 62/95  bone]
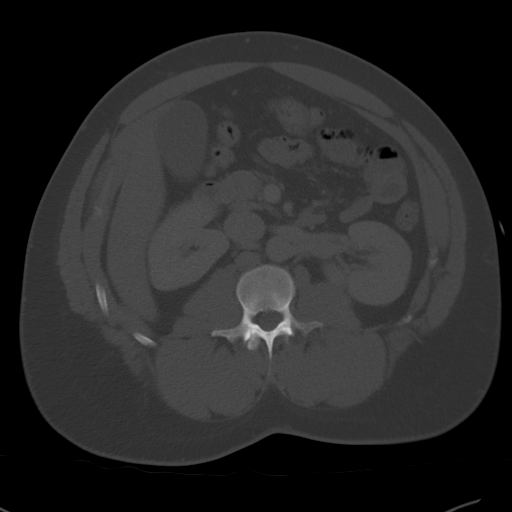
[im 70/95  soft-tissue]
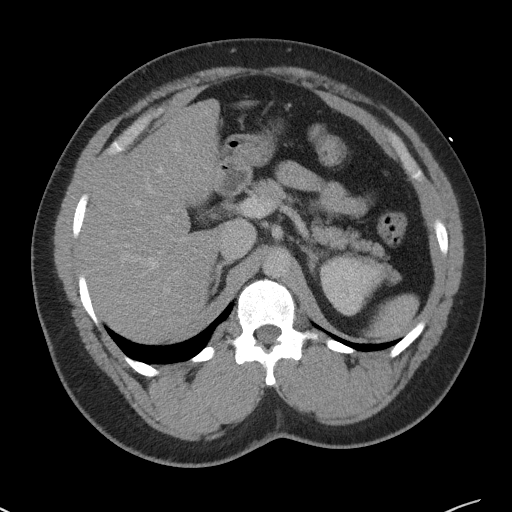
[im 74/95  soft-tissue]
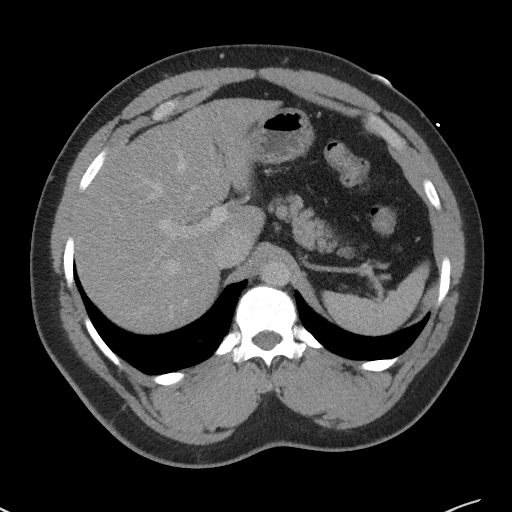
[im 82/95  soft-tissue]
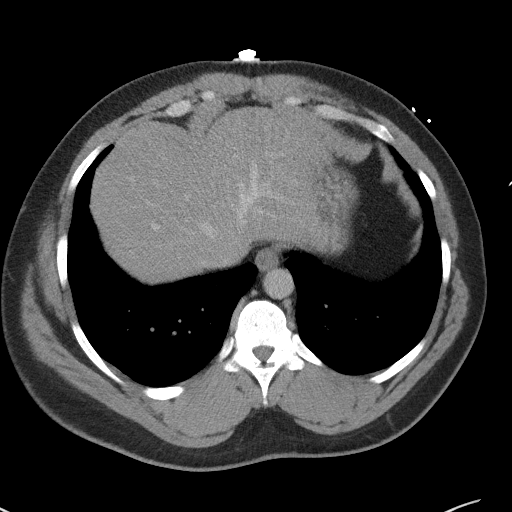
[im 90/95  soft-tissue]
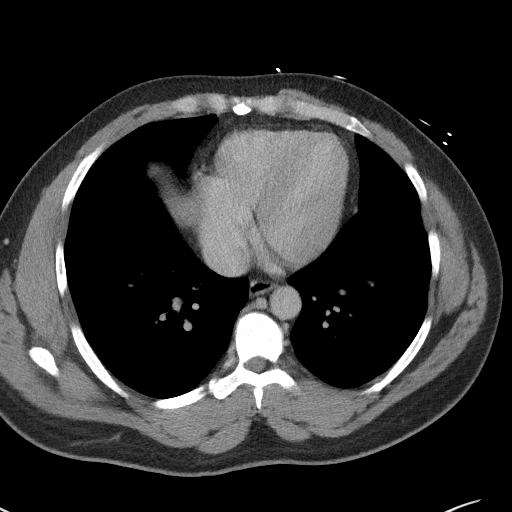

[Series 5: coronal st · coronal · 0.71mm/px · 3 of 103 slices shown]
[im 35/103  soft-tissue]
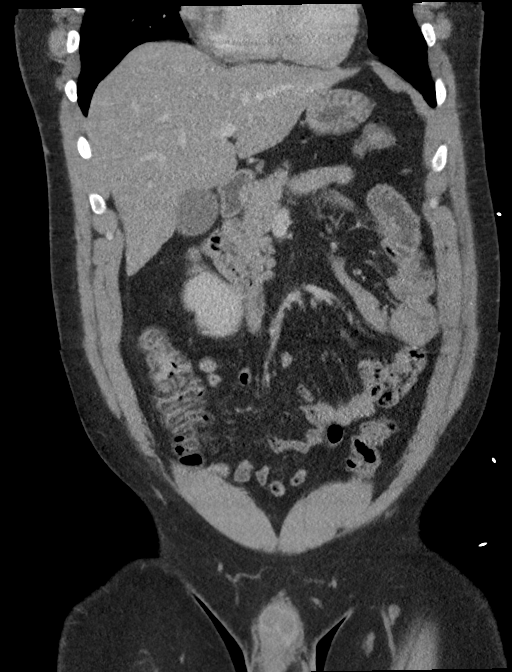
[im 46/103  soft-tissue]
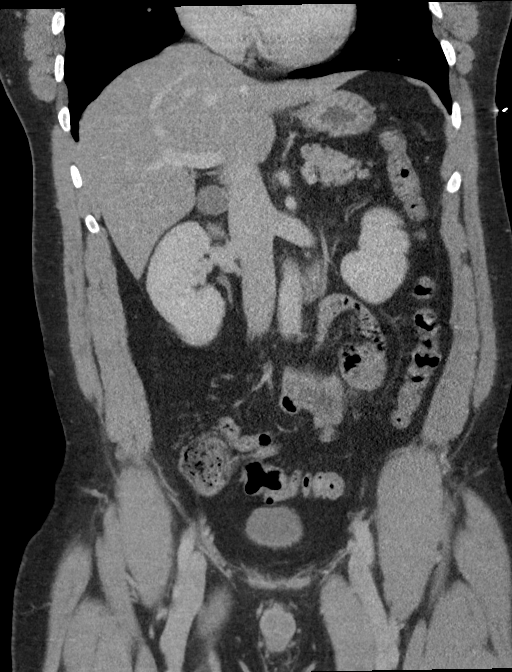
[im 57/103  soft-tissue]
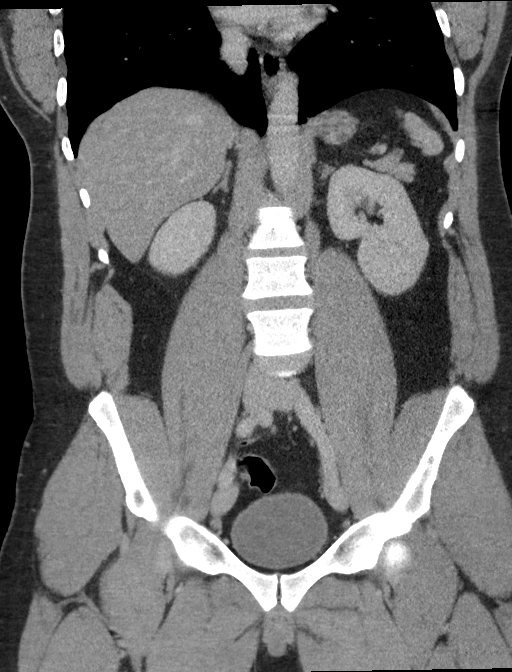

[16 of 46 positions shown; findings below may reference images not displayed]

FINDINGS: Lower chest: Unremarkable.

Hepatobiliary: No suspicious cystic or solid hepatic lesions. No
intra or extrahepatic biliary ductal dilatation. Gallbladder is
normal in appearance.

Pancreas: No pancreatic mass. No pancreatic ductal dilatation. No
pancreatic or peripancreatic fluid or inflammatory changes.

Spleen: Unremarkable.

Adrenals/Urinary Tract: Bilateral kidneys and bilateral adrenal
glands are normal in appearance. No hydroureteronephrosis. Urinary
bladder is normal in appearance.

Stomach/Bowel: Normal appearance of the stomach. No pathologic
dilatation of small bowel or colon. Normal appendix.

Vascular/Lymphatic: No atherosclerotic disease, aneurysm or
dissection noted in the abdominal or pelvic vasculature. No
lymphadenopathy noted in the abdomen or pelvis.

Reproductive: Prostate gland and seminal vesicles are unremarkable
in appearance.

Other: No significant volume of ascites.  No pneumoperitoneum.

Musculoskeletal: There are no aggressive appearing lytic or blastic
lesions noted in the visualized portions of the skeleton.
IMPRESSION: 1. No acute findings are noted in the abdomen or pelvis to account
for the patient's symptoms.
2. Normal appendix.

## 2021-08-08 ENCOUNTER — Other Ambulatory Visit: Payer: Self-pay

## 2021-08-08 ENCOUNTER — Emergency Department (HOSPITAL_COMMUNITY): Payer: Medicaid Other

## 2021-08-08 ENCOUNTER — Encounter (HOSPITAL_COMMUNITY): Payer: Self-pay | Admitting: Emergency Medicine

## 2021-08-08 ENCOUNTER — Emergency Department (HOSPITAL_COMMUNITY)
Admission: EM | Admit: 2021-08-08 | Discharge: 2021-08-08 | Disposition: A | Discharge status: Home / Self Care | Payer: Medicaid Other | Attending: Emergency Medicine | Admitting: Emergency Medicine

## 2021-08-08 DIAGNOSIS — R0602 Shortness of breath: Secondary | ICD-10-CM | POA: Diagnosis not present

## 2021-08-08 DIAGNOSIS — R079 Chest pain, unspecified: Secondary | ICD-10-CM | POA: Diagnosis present

## 2021-08-08 DIAGNOSIS — R61 Generalized hyperhidrosis: Secondary | ICD-10-CM | POA: Insufficient documentation

## 2021-08-08 DIAGNOSIS — R000 Tachycardia, unspecified: Secondary | ICD-10-CM | POA: Diagnosis not present

## 2021-08-08 DIAGNOSIS — R0789 Other chest pain: Secondary | ICD-10-CM | POA: Diagnosis not present

## 2021-08-08 LAB — CBC
HCT: 40.4 % (ref 39.0–52.0)
Hemoglobin: 13.6 g/dL (ref 13.0–17.0)
MCH: 30.2 pg (ref 26.0–34.0)
MCHC: 33.7 g/dL (ref 30.0–36.0)
MCV: 89.8 fL (ref 80.0–100.0)
Platelets: 220 10*3/uL (ref 150–400)
RBC: 4.5 MIL/uL (ref 4.22–5.81)
RDW: 14.8 % (ref 11.5–15.5)
WBC: 6.9 10*3/uL (ref 4.0–10.5)
nRBC: 0 % (ref 0.0–0.2)

## 2021-08-08 LAB — TROPONIN I (HIGH SENSITIVITY)
Troponin I (High Sensitivity): 3 ng/L (ref <18)
Troponin I (High Sensitivity): 3 ng/L (ref <18)

## 2021-08-08 LAB — BASIC METABOLIC PANEL
Anion gap: 12 (ref 5–15)
BUN: 14 mg/dL (ref 6–20)
CO2: 22 mmol/L (ref 22–32)
Calcium: 8.9 mg/dL (ref 8.9–10.3)
Chloride: 103 mmol/L (ref 98–111)
Creatinine, Ser: 1.12 mg/dL (ref 0.61–1.24)
GFR, Estimated: >60 mL/min (ref >60)
Glucose, Bld: 94 mg/dL (ref 70–99)
Potassium: 3.9 mmol/L (ref 3.5–5.1)
Sodium: 137 mmol/L (ref 135–145)

## 2021-08-08 MED ORDER — NITROGLYCERIN 0.4 MG SL SUBL
0.4000 mg | SUBLINGUAL_TABLET | Freq: Once | SUBLINGUAL | Status: AC
  Administered 2021-08-08: 0.4 mg via SUBLINGUAL
  Filled 2021-08-08: qty 1

## 2021-08-08 MED ORDER — MORPHINE SULFATE (PF) 2 MG/ML IV SOLN
2.0000 mg | Freq: Once | INTRAVENOUS | Status: AC
  Administered 2021-08-08: 2 mg via INTRAVENOUS
  Filled 2021-08-08: qty 1

## 2021-08-08 MED ORDER — NITROGLYCERIN 0.4 MG SL SUBL
SUBLINGUAL_TABLET | SUBLINGUAL | Status: AC
  Filled 2021-08-08: qty 1

## 2021-08-08 MED ORDER — SODIUM CHLORIDE 0.9 % IV BOLUS
1000.0000 mL | Freq: Once | INTRAVENOUS | Status: AC
  Administered 2021-08-08: 1000 mL via INTRAVENOUS

## 2021-08-08 MED ORDER — LORAZEPAM 2 MG/ML IJ SOLN
1.0000 mg | Freq: Once | INTRAMUSCULAR | Status: AC
  Administered 2021-08-08: 1 mg via INTRAVENOUS
  Filled 2021-08-08: qty 1

## 2021-08-08 MED ORDER — NITROGLYCERIN 0.4 MG SL SUBL: 0.4000 mg | SUBLINGUAL_TABLET | SUBLINGUAL | Status: DC | PRN

## 2021-08-08 MED ORDER — MORPHINE SULFATE (PF) 4 MG/ML IV SOLN: 4.0000 mg | Freq: Once | INTRAVENOUS | Status: DC

## 2021-08-08 NOTE — ED Notes (Signed)
ED Provider at bedside. 

## 2021-08-08 NOTE — ED Notes (Signed)
Patient transported to X-ray 

## 2021-08-08 NOTE — Discharge Instructions (Addendum)
Lab work and imaging are reassuring, please refrain from illicit drug use this will worsen your symptoms.  Recommend follow-up with cardiology and her PCP for further evaluation.  Come back to the emergency department if you develop chest pain, shortness of breath, severe abdominal pain, uncontrolled nausea, vomiting, diarrhea.

## 2021-08-08 NOTE — ED Triage Notes (Addendum)
Pt to the ED with [facility] complaining of chest pain for the last 3 days.   Pt had sternal chest pain until today when it moved to the left of his chest. Pt admits to using cocaine 7 hours ago. Pt received 324 mg of aspirin and one nitro from EMS.

## 2021-08-08 NOTE — ED Provider Notes (Signed)
[facility] Provider Note   CSN: [id number] Arrival date & time: 08/08/21  1830     History  Chief Complaint  Patient presents with   Chest Pain    Kyle Russo is a 37 y.o. male.  HPI Patient with medical history including illicit drug use presents with complaint of chest pain, chest pain started about 3 days ago, mainly in the middle of his chest and right side of his chest, pain does not radiate, is not a sharp or tearing like sensation to his back, pain is intermittent, has associated diaphoretic shortness of breath, states that pain started this morning when he woke up, and has gotten worse throughout the day, does not improve with change movement or p.o. intake, states that he use cocaine around 10 AM this morning, he mitts to smoking nicotine products, denies any illicit drug use, he has no cardiac history, no history of PEs or DVTs currently not on hormone therapy.  Denies any leaving or aggravating factors.  Home Medications Prior to Admission medications   Medication Sig Start Date End Date Taking? Authorizing Provider  cyclobenzaprine (FLEXERIL) 10 MG tablet Take 1 tablet (10 mg total) by mouth 2 (two) times daily as needed for muscle spasms. 09/05/18   Maudie Flakes, MD  HYDROcodone-acetaminophen (NORCO/VICODIN) 5-325 MG per tablet Take 1 tablet by mouth every 4 (four) hours as needed. Patient not taking: Reported on 09/05/2018 03/06/15   Evalee Jefferson, PA-C  HYDROcodone-acetaminophen (NORCO/VICODIN) 5-325 MG per tablet Take 1 tablet by mouth every 4 (four) hours as needed. Patient not taking: Reported on 09/05/2018 03/06/15   Evalee Jefferson, PA-C      Allergies    Patient has no known allergies.    Review of Systems   Review of Systems  Constitutional:  Negative for chills and fever.  Respiratory:  Negative for shortness of breath.   Cardiovascular:  Positive for chest pain. Negative for palpitations.  Gastrointestinal:  Negative for abdominal pain,  diarrhea, nausea and vomiting.  Neurological:  Negative for headaches.   Physical Exam Updated Vital Signs BP 103/60    Pulse 73    Temp 98.6 F (37 C) (Oral)    Resp 10    Ht 6\' 1"  (1.854 m)    Wt 117.9 kg    SpO2 98%    BMI 34.29 kg/m  Physical Exam Vitals and nursing note reviewed.  Constitutional:      General: He is in acute distress.     Appearance: He is diaphoretic. He is not ill-appearing.     Comments: Lumbar incision uncomfortable appearing, tachycardic, slightly diaphoretic, clutching the right side of his chest.  HENT:     Head: Normocephalic and atraumatic.     Nose: No congestion.  Eyes:     Conjunctiva/sclera: Conjunctivae normal.  Cardiovascular:     Rate and Rhythm: Regular rhythm. Tachycardia present.     Pulses: Normal pulses.     Heart sounds: No murmur heard.   No friction rub. No gallop.  Pulmonary:     Effort: No respiratory distress.     Breath sounds: No wheezing, rhonchi or rales.     Comments: Chest pain is nonreproducible. Chest:     Chest wall: No tenderness.  Abdominal:     Palpations: Abdomen is soft.     Tenderness: There is no abdominal tenderness. There is no right CVA tenderness or left CVA tenderness.  Musculoskeletal:     Right lower leg: No edema.  Left lower leg: No edema.  Skin:    General: Skin is warm.  Neurological:     Mental Status: He is alert.  Psychiatric:        Mood and Affect: Mood normal.    ED Results / Procedures / Treatments   Labs (all labs ordered are listed, but only abnormal results are displayed) Labs Reviewed  BASIC METABOLIC PANEL  CBC  TROPONIN I (HIGH SENSITIVITY)  TROPONIN I (HIGH SENSITIVITY)    EKG EKG Interpretation  Date/Time:  Sunday August 08 2021 18:41:32 EST Ventricular Rate:  102 PR Interval:    QRS Duration: 100 QT Interval:  372 QTC Calculation: 485 R Axis:   62 Text Interpretation: Atrial flutter with predominant 3:1 AV block Borderline prolonged QT interval Baseline  wander in lead(s) I II III aVR aVL aVF V1 V2 V3 V4 V5 V6 non ischemic not accounting for baseline wander... Confirmed by Noemi Chapel [phone]) on 08/08/2021 6:48:32 PM  Radiology DG Chest 2 View  Result Date: 08/08/2021 CLINICAL DATA:  Chest pain EXAM: CHEST - 2 VIEW COMPARISON:  None. FINDINGS: The heart size and mediastinal contours are within normal limits. Both lungs are clear. The visualized skeletal structures are unremarkable. IMPRESSION: No active cardiopulmonary disease. Electronically Signed   By: Rolm Baptise M.D.   On: 08/08/2021 19:02    Procedures Procedures    Medications Ordered in ED Medications  sodium chloride 0.9 % bolus 1,000 mL (0 mLs Intravenous Stopped 08/08/21 2152)  morphine (PF) 2 MG/ML injection 2 mg (2 mg Intravenous Given 08/08/21 1902)  nitroGLYCERIN (NITROSTAT) SL tablet 0.4 mg (0 mg Sublingual Not Given 08/08/21 1926)  LORazepam (ATIVAN) injection 1 mg (1 mg Intravenous Given 08/08/21 1900)  morphine (PF) 2 MG/ML injection 2 mg (2 mg Intravenous Given 08/08/21 2021)  LORazepam (ATIVAN) injection 1 mg (1 mg Intravenous Given 08/08/21 2020)    ED Course/ Medical Decision Making/ A&P                           Medical Decision Making Amount and/or Complexity of Data Reviewed Labs: ordered. Radiology: ordered.  Risk Prescription drug management.   This patient presents to the ED for concern of chest pain, this involves an extensive number of treatment options, and is a complaint that carries with it a high risk of complications and morbidity.  The differential diagnosis includes ACS, PE, dissection    Additional history obtained:  Additional history obtained from N/A    Co morbidities that complicate the patient evaluation  Illicit drug use  Social Determinants of Health:  N/A    Lab Tests:  I Ordered, and personally interpreted labs.  The pertinent results include:  CBC unremarkable BMP unremarkable negative delta troponin   Imaging Studies  ordered:  I ordered imaging studies including chest x-ray I independently visualized and interpreted imaging which showed unremarkable I agree with the radiologist interpretation   Cardiac Monitoring:  The patient was maintained on a cardiac monitor.  I personally viewed and interpreted the cardiac monitored which showed an underlying rhythm of: Sinus tach without signs of ischemia  Medicines ordered and prescription drug management:  I ordered medication including nitro, morphine, fluids, Ativan for chest pain I have reviewed the patients home medicines and have made adjustments as needed  Reevaluation: Patient is given Ativan, nitro, aspirin, morphine reassessed states she is feeling better still has continual pain vital signs remained stable will provide with additional dose and  reassess  Patient reassessed found asleep, vital signs reassuring states she is feeling better still has some slight pain but overall feels better  Patient is reassessed updated lab and imaging has no complaints he is agreeable for discharge  Rule out I have low suspicion for ACS as history is atypical, patient has no cardiac history, EKG was sinus rhythm without signs of ischemia, patient had a delta troponin.  Low suspicion for PE as patient denies pleuritic chest pain, shortness of breath, patient denies leg pain, no pedal edema noted on exam, patient was PERC negative.  Low suspicion for AAA or aortic dissection as history is atypical, patient has low risk factors.  Low suspicion for systemic infection as patient is nontoxic-appearing, vital signs reassuring, no obvious source infection noted on exam.     Dispostion and problem list  After consideration of the diagnostic results and the patients response to treatment, I feel that the patent would benefit from discharge.  Chest pain improved-unclear allergy by SPECT likely secondary due to cocaine use, will recommend discontinuing illicit drug use  follow-up with cardiology for further evaluation, check return precautions.            Final Clinical Impression(s) / ED Diagnoses Final diagnoses:  Atypical chest pain    Rx / DC Orders ED Discharge Orders     None         Aron Baba 08/08/21 2224    Noemi Chapel, MD 08/08/21 2258

## 2021-08-22 ENCOUNTER — Encounter (HOSPITAL_COMMUNITY): Payer: Self-pay

## 2021-08-22 ENCOUNTER — Emergency Department (HOSPITAL_COMMUNITY)
Admission: EM | Admit: 2021-08-22 | Discharge: 2021-08-22 | Disposition: A | Discharge status: Home / Self Care | Payer: Medicaid Other | Attending: Emergency Medicine | Admitting: Emergency Medicine

## 2021-08-22 ENCOUNTER — Other Ambulatory Visit: Payer: Self-pay

## 2021-08-22 DIAGNOSIS — K0381 Cracked tooth: Secondary | ICD-10-CM | POA: Insufficient documentation

## 2021-08-22 DIAGNOSIS — R220 Localized swelling, mass and lump, head: Secondary | ICD-10-CM | POA: Diagnosis present

## 2021-08-22 DIAGNOSIS — K047 Periapical abscess without sinus: Secondary | ICD-10-CM | POA: Diagnosis not present

## 2021-08-22 MED ORDER — AMOXICILLIN 250 MG PO CAPS
500.0000 mg | ORAL_CAPSULE | Freq: Once | ORAL | Status: AC
  Administered 2021-08-22: 500 mg via ORAL
  Filled 2021-08-22: qty 2

## 2021-08-22 MED ORDER — HYDROCODONE-ACETAMINOPHEN 5-325 MG PO TABS: 1.0000 | ORAL_TABLET | ORAL | 0 refills | Status: AC | PRN

## 2021-08-22 MED ORDER — HYDROCODONE-ACETAMINOPHEN 5-325 MG PO TABS
1.0000 | ORAL_TABLET | Freq: Once | ORAL | Status: AC
  Administered 2021-08-22: 1 via ORAL
  Filled 2021-08-22: qty 1

## 2021-08-22 MED ORDER — AMOXICILLIN 500 MG PO CAPS: 500.0000 mg | ORAL_CAPSULE | Freq: Three times a day (TID) | ORAL | 0 refills | Status: AC

## 2021-08-22 MED ORDER — HYDROCODONE-ACETAMINOPHEN 5-325 MG PO TABS: 1.0000 | ORAL_TABLET | ORAL | 0 refills | Status: DC | PRN

## 2021-08-22 NOTE — Discharge Instructions (Signed)
Take the entire course of the antibiotics prescribed.  You have also been prescribed some pain medication.  This will make you drowsy, do not drive within 4 hours of taking this medication.  Call Dr. Haig Prophet tomorrow morning to arrange an office visit with him he is the dentist that is on-call for [place] today, you need to be seen within the next several days to have this dental abscess definitively drained and this fractured tooth addressed as well.

## 2021-08-22 NOTE — ED Triage Notes (Signed)
Patient reports having dental pain and swelling on the right lower side of the mouth. Patient also reports feeling a knot on his lower jaw with difficulty chewing. Patient states one of his upper teeth broke last night. Patient took ibuprofen for pain at 7am. No trouble swallowing or breathing.

## 2021-08-22 NOTE — ED Provider Notes (Signed)
[facility] EMERGENCY DEPARTMENT Provider Note   CSN: [id number] Arrival date & time: 08/22/21  1700     History  Chief Complaint  Patient presents with   Dental Pain    Kyle Russo is a 36 y.o. male.  The history is provided by the patient.  Dental Pain Location:  Upper Upper teeth location:  2/RU 2nd molar Quality:  Sharp and throbbing Severity:  Severe Onset quality:  Sudden Duration:  2 days (He had a partially fractured tooth at this site which broke completely several days ago.  He developed swelling of the gingiva 2 days ago.) Progression:  Worsening Chronicity:  New Context: abscess and dental fracture   Relieved by:  Nothing Worsened by:  Jaw movement (Chewing on that side) Ineffective treatments:  Acetaminophen Associated symptoms: facial swelling and gum swelling   Associated symptoms: no difficulty swallowing, no drooling, no fever, no neck pain, no neck swelling and no trismus       Home Medications Prior to Admission medications   Medication Sig Start Date End Date Taking? Authorizing Provider  amoxicillin (AMOXIL) 500 MG capsule Take 1 capsule (500 mg total) by mouth 3 (three) times daily. 08/22/21  Yes Efton Thomley, Raynelle Fanning, PA-C  HYDROcodone-acetaminophen (NORCO/VICODIN) 5-325 MG tablet Take 1 tablet by mouth every 4 (four) hours as needed. 08/22/21  Yes Keison Glendinning, Raynelle Fanning, PA-C  cyclobenzaprine (FLEXERIL) 10 MG tablet Take 1 tablet (10 mg total) by mouth 2 (two) times daily as needed for muscle spasms. 09/05/18   Sabas Sous, MD  HYDROcodone-acetaminophen (NORCO/VICODIN) 5-325 MG tablet Take 1 tablet by mouth every 4 (four) hours as needed. 08/22/21   Burgess Amor, PA-C      Allergies    Patient has no known allergies.    Review of Systems   Review of Systems  Constitutional:  Negative for chills and fever.  HENT:  Positive for dental problem and facial swelling. Negative for drooling and sore throat.   Respiratory:  Negative for shortness of breath, wheezing  and stridor.   Musculoskeletal:  Negative for neck pain and neck stiffness.   Physical Exam Updated Vital Signs BP (!) 155/98 (BP Location: Right Arm)    Pulse 72    Temp 98.2 F (36.8 C) (Oral)    Resp 18    Ht 6\' 1"  (1.854 m)    Wt 120.7 kg    SpO2 98%    BMI 35.11 kg/m  Physical Exam Constitutional:      General: He is not in acute distress.    Appearance: He is well-developed.  HENT:     Head: Normocephalic and atraumatic.     Jaw: No trismus.     Right Ear: Tympanic membrane and external ear normal.     Left Ear: Tympanic membrane and external ear normal.     Mouth/Throat:     Mouth: Mucous membranes are moist. No oral lesions.     Dentition: Abnormal dentition. Gingival swelling and dental abscesses present.     Tongue: No lesions.     Pharynx: Oropharynx is clear. No posterior oropharyngeal erythema.     Tonsils: No tonsillar exudate.     Comments: Patient has several areas of poor dentition.  His second upper molar tooth on right is fractured to the gingival line.  He has moderate edema of his buccal mucosa on the right side.  There is a small amount of purulence that is draining from this abscess site.  No trismus.Cough.  Sublingual space is Eyes:  Conjunctiva/sclera: Conjunctivae normal.  Cardiovascular:     Rate and Rhythm: Normal rate.     Heart sounds: Normal heart sounds.  Pulmonary:     Effort: Pulmonary effort is normal.  Abdominal:     General: There is no distension.  Musculoskeletal:        General: Normal range of motion.     Cervical back: Normal range of motion and neck supple.  Lymphadenopathy:     Cervical: No cervical adenopathy.  Skin:    General: Skin is warm and dry.     Findings: No erythema.  Neurological:     Mental Status: He is alert and oriented to person, place, and time.    ED Results / Procedures / Treatments   Labs (all labs ordered are listed, but only abnormal results are displayed) Labs Reviewed - No data to  display  EKG None  Radiology No results found.  Procedures Procedures    Medications Ordered in ED Medications  amoxicillin (AMOXIL) capsule 500 mg (500 mg Oral Given 08/22/21 2015)  HYDROcodone-acetaminophen (NORCO/VICODIN) 5-325 MG per tablet 1 tablet (1 tablet Oral Given 08/22/21 2015)    ED Course/ Medical Decision Making/ A&P                           Medical Decision Making Patient with an obvious dental abscess which is actively draining.  There is no trismus, no difficulty swallowing.  Sublingual space is soft.  There is no evidence of a developing Ludwigs angina at this time.  Risk Prescription drug management. Risk Details: Patient was started on amoxicillin, also given hydrocodone for pain relief.  He was given referral for dental follow-up, he is to call tomorrow morning for an appointment time with Dr. Mia Creek.  Patient understands plan, outlined return precautions.           Final Clinical Impression(s) / ED Diagnoses Final diagnoses:  Dental abscess    Rx / DC Orders ED Discharge Orders          Ordered    amoxicillin (AMOXIL) 500 MG capsule  3 times daily        08/22/21 2019    HYDROcodone-acetaminophen (NORCO/VICODIN) 5-325 MG tablet  Every 4 hours PRN,   Status:  Discontinued        08/22/21 2019    HYDROcodone-acetaminophen (NORCO/VICODIN) 5-325 MG tablet  Every 4 hours PRN        08/22/21 2020    HYDROcodone-acetaminophen (NORCO/VICODIN) 5-325 MG tablet  Every 4 hours PRN        08/22/21 2020              Burgess Amor, PA-C 08/23/21 7893    Bethann Berkshire, MD 08/24/21 [phone]

## 2021-08-23 MED FILL — Hydrocodone-Acetaminophen Tab 5-325 MG: ORAL | Qty: 6 | Status: AC

## 2023-04-06 ENCOUNTER — Other Ambulatory Visit: Payer: Self-pay

## 2023-04-06 ENCOUNTER — Encounter (HOSPITAL_COMMUNITY): Payer: Self-pay

## 2023-04-06 ENCOUNTER — Emergency Department (HOSPITAL_COMMUNITY)
Admission: EM | Admit: 2023-04-06 | Discharge: 2023-04-06 | Disposition: A | Discharge status: Home / Self Care | Payer: Medicaid Other | Attending: Emergency Medicine | Admitting: Emergency Medicine

## 2023-04-06 DIAGNOSIS — U071 COVID-19: Secondary | ICD-10-CM | POA: Insufficient documentation

## 2023-04-06 DIAGNOSIS — F17210 Nicotine dependence, cigarettes, uncomplicated: Secondary | ICD-10-CM | POA: Diagnosis not present

## 2023-04-06 DIAGNOSIS — M791 Myalgia, unspecified site: Secondary | ICD-10-CM | POA: Diagnosis present

## 2023-04-06 LAB — COMPREHENSIVE METABOLIC PANEL
ALT: 21 U/L (ref 0–44)
AST: 30 U/L (ref 15–41)
Albumin: 4.4 g/dL (ref 3.5–5.0)
Alkaline Phosphatase: 77 U/L (ref 38–126)
Anion gap: 8 (ref 5–15)
BUN: 10 mg/dL (ref 6–20)
CO2: 22 mmol/L (ref 22–32)
Calcium: 8.6 mg/dL — ABNORMAL LOW (ref 8.9–10.3)
Chloride: 107 mmol/L (ref 98–111)
Creatinine, Ser: 0.86 mg/dL (ref 0.61–1.24)
GFR, Estimated: >60 mL/min (ref >60)
Glucose, Bld: 91 mg/dL (ref 70–99)
Potassium: 3.9 mmol/L (ref 3.5–5.1)
Sodium: 137 mmol/L (ref 135–145)
Total Bilirubin: 0.8 mg/dL (ref 0.3–1.2)
Total Protein: 7.5 g/dL (ref 6.5–8.1)

## 2023-04-06 LAB — CBC
HCT: 39.4 % (ref 39.0–52.0)
Hemoglobin: 13.4 g/dL (ref 13.0–17.0)
MCH: 30.5 pg (ref 26.0–34.0)
MCHC: 34 g/dL (ref 30.0–36.0)
MCV: 89.7 fL (ref 80.0–100.0)
Platelets: 199 10*3/uL (ref 150–400)
RBC: 4.39 MIL/uL (ref 4.22–5.81)
RDW: 15.5 % (ref 11.5–15.5)
WBC: 8.4 10*3/uL (ref 4.0–10.5)
nRBC: 0 % (ref 0.0–0.2)

## 2023-04-06 LAB — RESP PANEL BY RT-PCR (RSV, FLU A&B, COVID)  RVPGX2
Influenza A by PCR: NEGATIVE
Influenza B by PCR: NEGATIVE
Resp Syncytial Virus by PCR: NEGATIVE
SARS Coronavirus 2 by RT PCR: POSITIVE — AB

## 2023-04-06 LAB — LIPASE, BLOOD: Lipase: 23 U/L (ref 11–51)

## 2023-04-06 MED ORDER — ACETAMINOPHEN 500 MG PO TABS
1000.0000 mg | ORAL_TABLET | Freq: Once | ORAL | Status: AC
  Administered 2023-04-06: 1000 mg via ORAL
  Filled 2023-04-06: qty 2

## 2023-04-06 MED ORDER — PAXLOVID (300/100) 20 X 150 MG & 10 X 100MG PO TBPK: 3.0000 | ORAL_TABLET | Freq: Two times a day (BID) | ORAL | 0 refills | Status: AC

## 2023-04-06 MED ORDER — IBUPROFEN 400 MG PO TABS
600.0000 mg | ORAL_TABLET | Freq: Once | ORAL | Status: AC
Start: 2023-04-06 — End: 2023-04-06
  Administered 2023-04-06: 600 mg via ORAL
  Filled 2023-04-06: qty 2

## 2023-04-06 NOTE — Discharge Instructions (Addendum)
You are seen today for cough, chills and bodyaches.  Your blood work was reassuring but you did have positive COVID-19 test.  Drink plenty of fluids and rest.  Since you are a smoker we have prescribed you Paxlovid since you are at increased risk of hospitalization from this.  Take the Paxlovid as prescribed.  Come back to the ER if you have new or worsening symptoms such as shortness of breath, chest pain, vomiting, or any other worrisome changes.  Drink plenty of fluids and rest.  Take over-the-counter Tylenol Motrin as directed on packaging as needed for body aches and chills.

## 2023-04-06 NOTE — ED Provider Notes (Signed)
[place] EMERGENCY DEPARTMENT AT [facility] Provider Note   CSN: [id number] Arrival date & time: 04/06/23  1244     History  Chief Complaint  Patient presents with   Abdominal Pain    Kyle Russo is a 37 y.o. male.  Denies any significant PMH, not on any daily medicines.  He is a smoker.  Presents for body aches, chills, diarrhea that started this morning, states he has pain all over, denies known fever, no chest pain or shortness of breath.  No cough.  No sick contacts.  He states he has been shaking and very cold.  He has not taken any medications today.  Abdominal Pain      Home Medications Prior to Admission medications   Medication Sig Start Date End Date Taking? Authorizing Provider  nirmatrelvir/ritonavir (PAXLOVID, 300/100,) 20 x 150 MG & 10 x 100MG  TBPK Take 3 tablets by mouth 2 (two) times daily for 5 days. Patient GFR is 60. Take nirmatrelvir (150 mg) two tablets twice daily for 5 days and ritonavir (100 mg) one tablet twice daily for 5 days. 04/06/23 04/11/23 Yes Nioma Mccubbins A, PA-C  amoxicillin (AMOXIL) 500 MG capsule Take 1 capsule (500 mg total) by mouth 3 (three) times daily. 08/22/21   Burgess Amor, PA-C  cyclobenzaprine (FLEXERIL) 10 MG tablet Take 1 tablet (10 mg total) by mouth 2 (two) times daily as needed for muscle spasms. 09/05/18   Sabas Sous, MD  HYDROcodone-acetaminophen (NORCO/VICODIN) 5-325 MG tablet Take 1 tablet by mouth every 4 (four) hours as needed. 08/22/21   Burgess Amor, PA-C  HYDROcodone-acetaminophen (NORCO/VICODIN) 5-325 MG tablet Take 1 tablet by mouth every 4 (four) hours as needed. 08/22/21   Burgess Amor, PA-C      Allergies    Patient has no known allergies.    Review of Systems   Review of Systems  Gastrointestinal:  Positive for abdominal pain.    Physical Exam Updated Vital Signs BP 126/78   Pulse (!) 111   Temp 99.1 F (37.3 C) (Oral)   Resp (!) 24   Wt 108.9 kg   SpO2 95%   BMI 31.66 kg/m  Physical  Exam Vitals and nursing note reviewed.  Constitutional:      General: He is not in acute distress.    Appearance: He is well-developed.  HENT:     Head: Normocephalic and atraumatic.     Mouth/Throat:     Mouth: Mucous membranes are moist.     Pharynx: Oropharynx is clear. Uvula midline.     Tonsils: No tonsillar abscesses.  Eyes:     Conjunctiva/sclera: Conjunctivae normal.  Cardiovascular:     Rate and Rhythm: Normal rate and regular rhythm.     Heart sounds: No murmur heard. Pulmonary:     Effort: Pulmonary effort is normal. No respiratory distress.     Breath sounds: Normal breath sounds.  Abdominal:     Palpations: Abdomen is soft.     Tenderness: There is no abdominal tenderness.  Musculoskeletal:        General: No swelling.     Cervical back: Neck supple.  Skin:    General: Skin is warm and dry.     Capillary Refill: Capillary refill takes less than 2 seconds.  Neurological:     Mental Status: He is alert.  Psychiatric:        Mood and Affect: Mood normal.     ED Results / Procedures / Treatments   Labs (  all labs ordered are listed, but only abnormal results are displayed) Labs Reviewed  RESP PANEL BY RT-PCR (RSV, FLU A&B, COVID)  RVPGX2 - Abnormal; Notable for the following components:      Result Value   SARS Coronavirus 2 by RT PCR POSITIVE (*)    All other components within normal limits  COMPREHENSIVE METABOLIC PANEL - Abnormal; Notable for the following components:   Calcium 8.6 (*)    All other components within normal limits  LIPASE, BLOOD  CBC    EKG None  Radiology No results found.  Procedures Procedures    Medications Ordered in ED Medications  ibuprofen (ADVIL) tablet 600 mg (600 mg Oral Given 04/06/23 1609)  acetaminophen (TYLENOL) tablet 1,000 mg (1,000 mg Oral Given 04/06/23 1609)    ED Course/ Medical Decision Making/ A&P                                 Medical Decision Making Dx: Viral illness, COVID-19, pneumonia, sepsis,  other  Course: Patient presents with sudden onset chills, body aches, diarrhea today with some congestion and mild sore throat, is well-appearing, well-hydrated but having chills, mildly tachycardic, he has no significant PMH but is a smoker.  CBC, lipase and CMP are all reassuring but patient is positive for COVID-19 which is without symptoms.  Discussed risk and benefits of Paxlovid treatment and patient is agreeable with this and would like patient for this, also asking for work note which will be provided, advised on isolation, oral rehydration, OTC medications and strict return precautions.  Amount and/or Complexity of Data Reviewed External Data Reviewed: notes. Labs: ordered.  Risk OTC drugs. Prescription drug management.           Final Clinical Impression(s) / ED Diagnoses Final diagnoses:  COVID-19    Rx / DC Orders ED Discharge Orders          Ordered    nirmatrelvir/ritonavir (PAXLOVID, 300/100,) 20 x 150 MG & 10 x 100MG  TBPK  2 times daily        04/06/23 1613              Ma Rings, PA-C 04/06/23 1629    Jacalyn Lefevre, MD 04/06/23 1805

## 2023-04-06 NOTE — ED Triage Notes (Addendum)
BIBA c/o epigastric abd pain radiating into centralized chest, left shoulder and left neck with nausea, headache, and chills.  Abd distention noted.  Cbg-115 Ems admin 324 ASA.  LBM: 1 hour PTA described as diarrhea.

## 2023-10-07 IMAGING — DX DG CHEST 2V
2 series · 3 of 3 positions shown · non-contrast
Comparison: None.

CLINICAL DATA: Chest pain

EXAM:
CHEST - 2 VIEW

[Series 1: chest pa · 0.14mm/px · 2 of 2 slices shown]
[im 1/2]
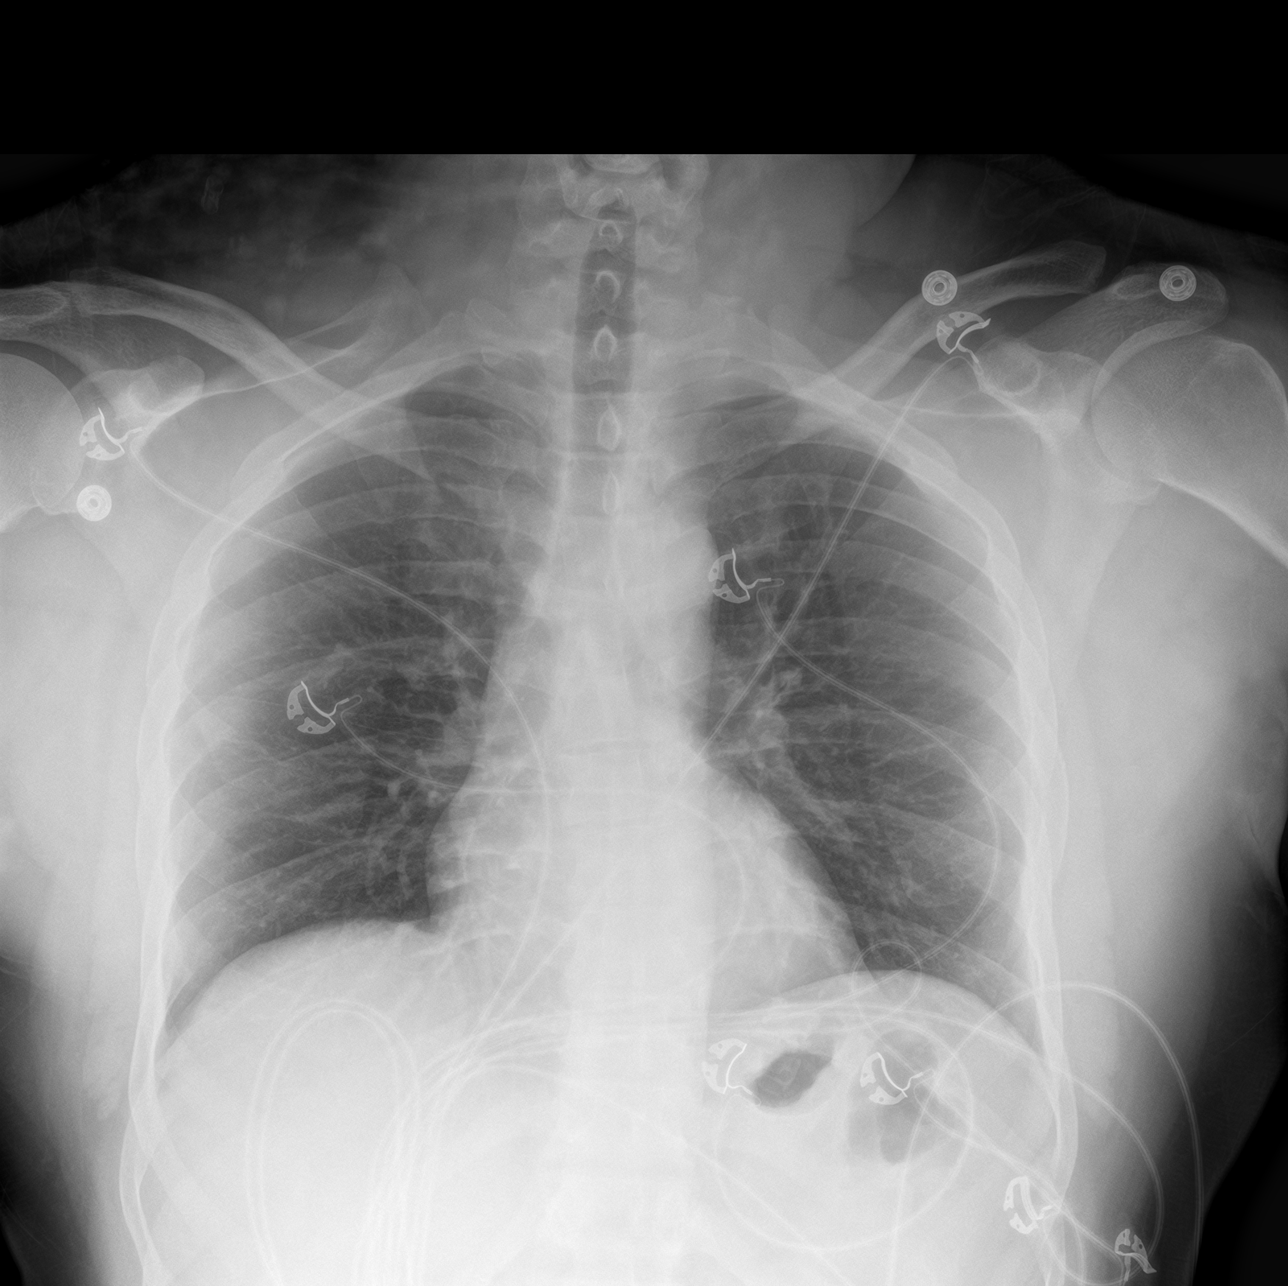
[im 2/2]
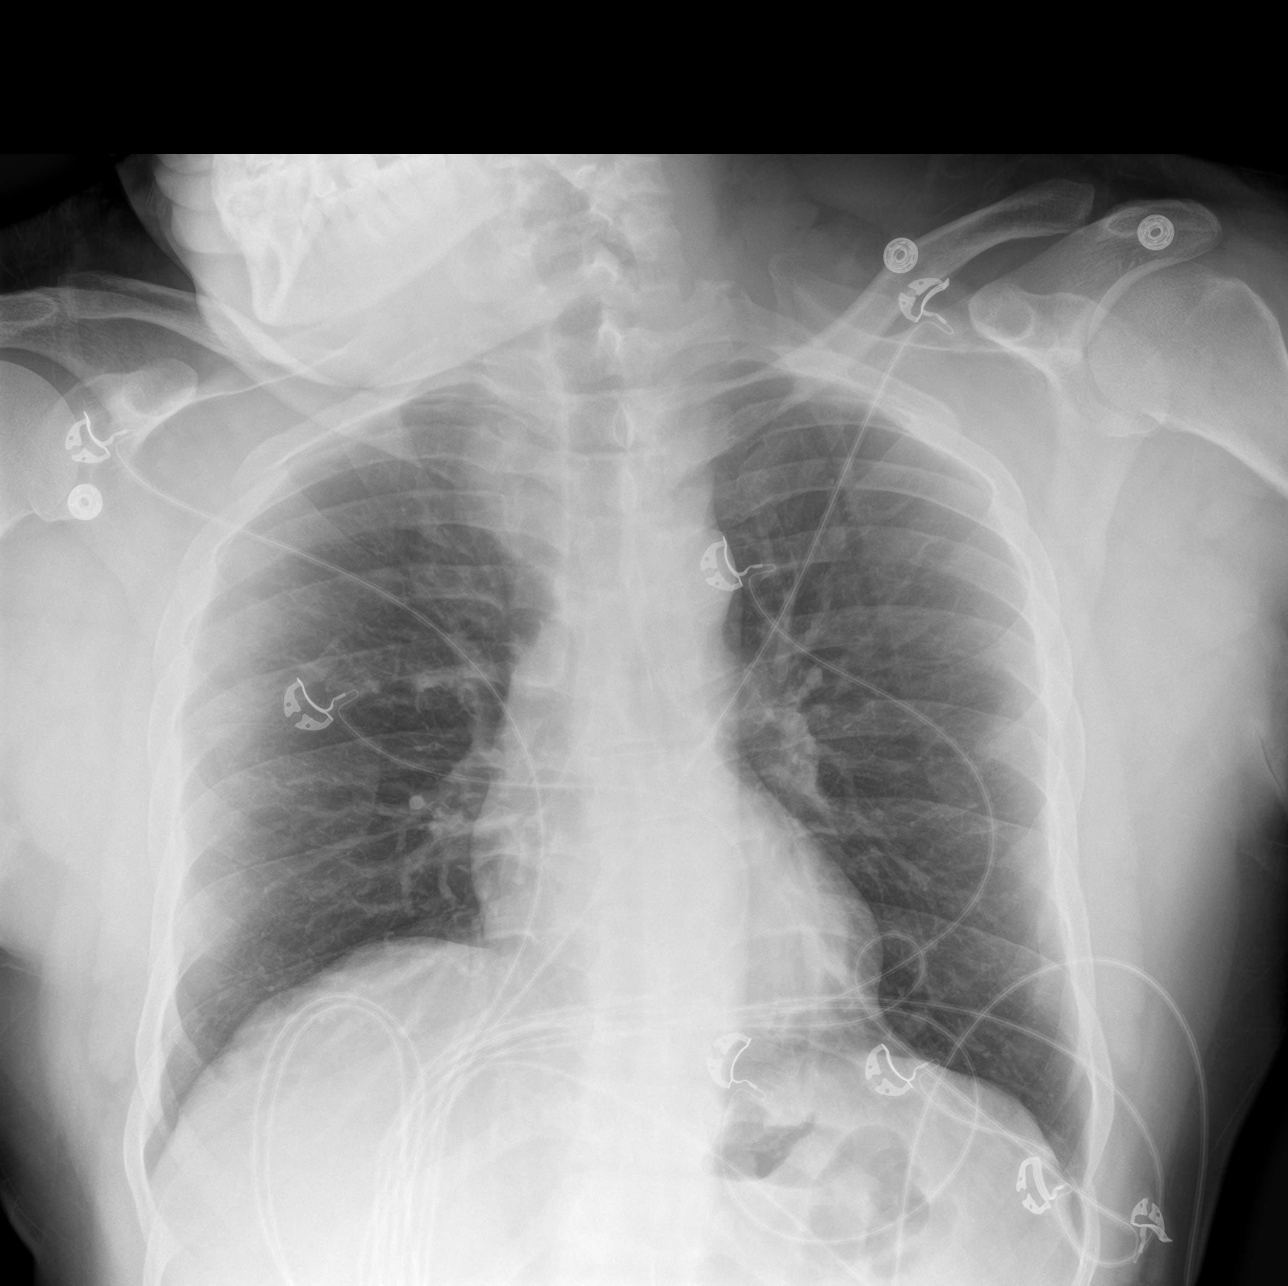

[chest lat]
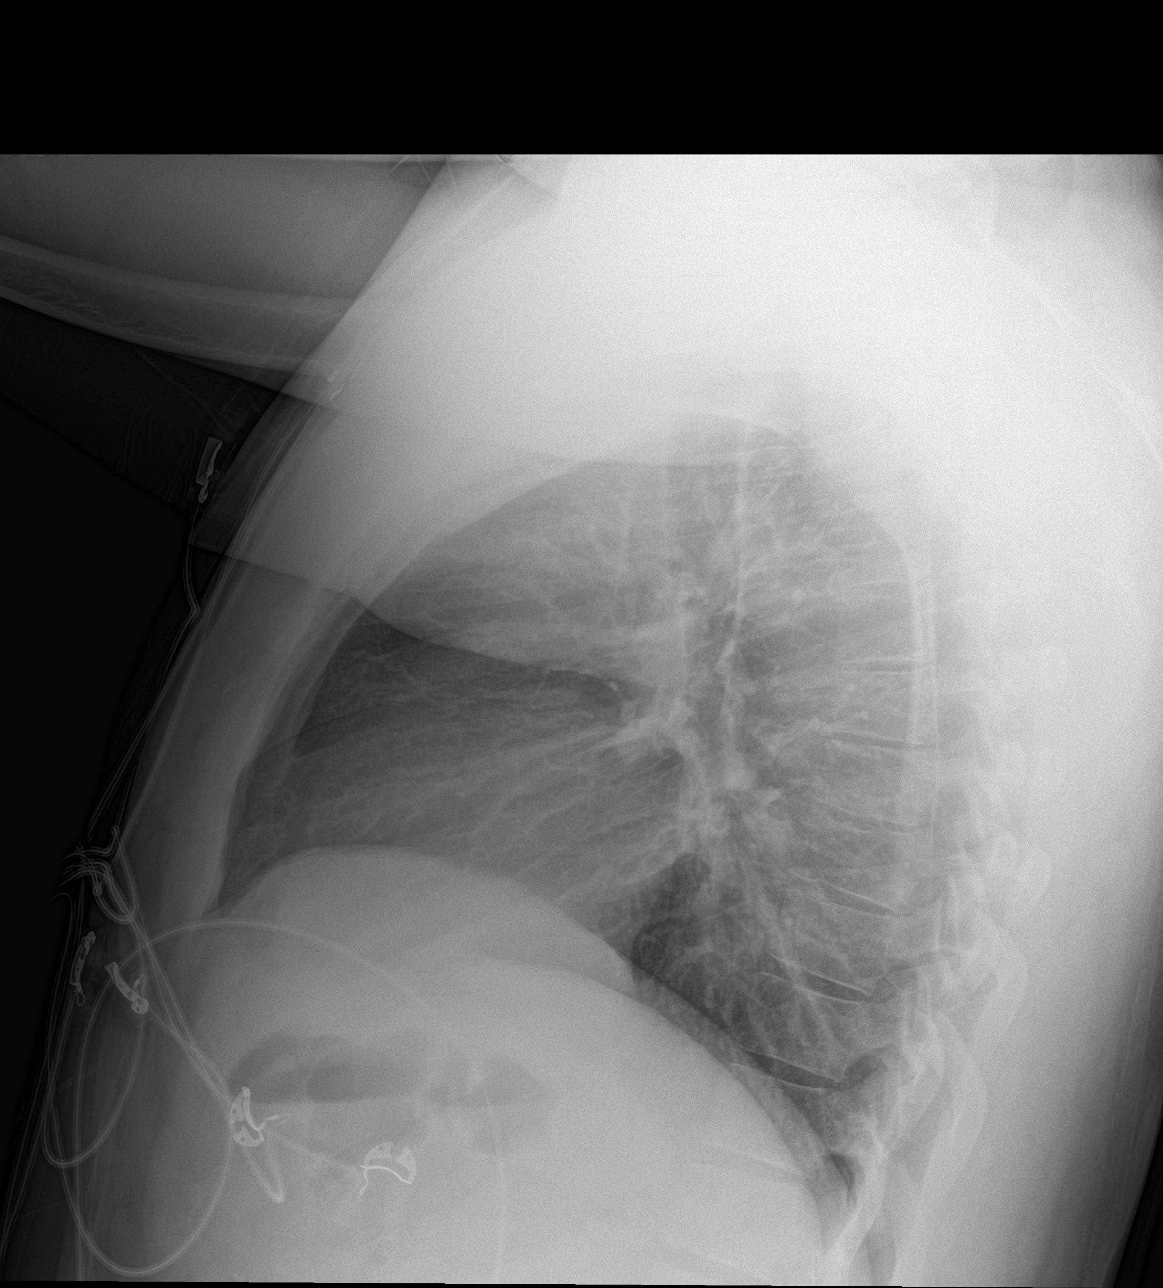

[3 of 3 positions shown; findings below may reference images not displayed]

FINDINGS: The heart size and mediastinal contours are within normal limits.
Both lungs are clear. The visualized skeletal structures are
unremarkable.
IMPRESSION: No active cardiopulmonary disease.
